# Patient Record
Sex: Female | Born: 2000 | Race: Black or African American | Hispanic: No | Marital: Single | Smoking: Never smoker
Health system: Southern US, Community
[De-identification: ages and names within clinical notes are randomized; demographics above are authoritative.]

---
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
## 2022-02-02 ENCOUNTER — Ambulatory Visit (HOSPITAL_COMMUNITY)
Admission: EM | Admit: 2022-02-02 | Discharge: 2022-02-02 | Disposition: A | Discharge status: Home / Self Care | Payer: Medicaid Other | Attending: Psychiatry | Admitting: Psychiatry

## 2022-02-02 ENCOUNTER — Ambulatory Visit (INDEPENDENT_AMBULATORY_CARE_PROVIDER_SITE_OTHER): Payer: Medicaid Other | Admitting: Family Medicine

## 2022-02-02 VITALS — BP 118/53 | HR 93 | Ht 69.0 in | Wt 201.0 lb

## 2022-02-02 DIAGNOSIS — F4323 Adjustment disorder with mixed anxiety and depressed mood: Secondary | ICD-10-CM

## 2022-02-02 DIAGNOSIS — F39 Unspecified mood [affective] disorder: Secondary | ICD-10-CM | POA: Diagnosis present

## 2022-02-02 DIAGNOSIS — Z30017 Encounter for initial prescription of implantable subdermal contraceptive: Secondary | ICD-10-CM | POA: Diagnosis not present

## 2022-02-02 DIAGNOSIS — B360 Pityriasis versicolor: Secondary | ICD-10-CM | POA: Diagnosis not present

## 2022-02-02 DIAGNOSIS — Z553 Underachievement in school: Secondary | ICD-10-CM | POA: Insufficient documentation

## 2022-02-02 LAB — POCT URINE PREGNANCY: Preg Test, Ur: NEGATIVE

## 2022-02-02 MED ORDER — SELENIUM SULFIDE 2.5 % EX LOTN: 1.0000 "application " | TOPICAL_LOTION | Freq: Every day | CUTANEOUS | 1 refills | Status: DC | PRN

## 2022-02-02 MED ORDER — MEDROXYPROGESTERONE ACETATE 150 MG/ML IM SUSP
150.0000 mg | Freq: Once | INTRAMUSCULAR | Status: AC
  Administered 2022-02-02: 150 mg via INTRAMUSCULAR

## 2022-02-02 NOTE — ED Triage Notes (Signed)
Pt presents to [facility] with passive SI. Pt states she went to her PCP today and made a comment about being suicidal 3 days ago and they recommended an evaluation. Pt states that she was feeling suicidal 3 days ago but she does not have this feeling today. Pt states that she was feeling overwhelmed with financial, relationship and friendship issues this week. Pt is tearful during triage process. Pt states she was diagnosed with depression/anxiety 4 years ago but was not prescribed medication.Pt states that she would be able to keep herself safe and she lives with her father [name] and gives permission to contact him for safety. Pt states that she would like to get started with outpatient services. Pt denies HI and AVH. ?

## 2022-02-02 NOTE — Patient Instructions (Addendum)
Thank you for coming to see me today. It was a pleasure.  ? ?Go to [facility] now to be evaluated. ?[address], [place], [place]  ?[phone] ? ?I will call you later today to see how things are ? ?Please follow-up with PCP in 1 week ? ?If you have any questions or concerns, please do not hesitate to call the office at [phone]. ? ?Best,  ? ?Carollee Leitz, MD  ? ?  ?Therapy and Counseling Resources ?Most providers on this list will take Medicaid. Patients with commercial insurance or Medicare should contact their insurance company to get a list of in network providers. ? ?[facility] ? [address] [place], [place] [address]      [phone] ? ?[facility] ?[place], [place] [address] ?[phone] ? ?[facility] ?[address] [place], [place] [address]       [phone]    ? ? ?[facility] ?[address], [place], [place] ? ?[facility]:  [address] ? ?[facility]:  ?[place] [phone] ? ?[employer] [facility] (under & uninsured) ?[address] [place] [phone]    [email or web address]   ? ?[facility] ?[place]     Phone:  [phone]     Alba Belden  [phone]  ? ?[place] ?[address] [place], [place] ext [phone] ? ?[facility]: [place], [place], [place]  ? ?[facility] ([place] therapist) ?[place] [address]   [place] [address]    [phone]   ? ?[facility]   ?[place] [place],  [place], [place]  ? ?[facility]  ?[address] [place]  [phone] ? ?[facility]  ?[place], [place]        [phone] ? ?Open Access/Walk In Clinic under & uninsured ?[place]ly Sessions, To schedule an appointment call [phone]- [phone] ?[address], [place] [phone]):  Molli Knock - Fri from 8 AM - 3 PM ?Moving June 1 to [employer] at [facility] [address], [place] [address] ? ?[facility] [place],  ?([place])   [place]: [phone]) 8:30 - 12; 1 - 2:30 ? ?[facility] [place],  ?[address], [place]    [phone]):8:30 - 12; 2 - 3PM ? ?[facility],  ?[address]  [place]; [phone]):   Mon - Fri 8 AM - 5 PM ? ?[facility] ?[place]  MWF 12:30 to 3:00 or call to schedule an appointment  [phone] ? ?Specific Provider options ?Psychology Today  [email or web address]://www.psychologytoday.com/us ?click on find a therapist  ?enter your zip code ?left side and select or tailor a therapist for your specific need.  ? ?[facility] Provider Directory ?[email or web address]  (Medicaid)   Follow all drop down to find a provider ? ?Social Support program ?[place] ?[phone] or [email or web address] ?[address], [place], [place] Recovery support and educational  ? ?In home counseling ?[place] ?Telephone: [phone]  office in [facility] [email or web address]   ?  Does not take reg. Medicaid or Medicare ?private insurance [employer], [place] health Choice, [place], [place], [place], [place], [place] Choice ? ?24- Hour Availability:  ?[place] or [phone] ? ?[facility] [employer] [phone] ? ?[facility]  [phone]  ? ?[place]  [phone] (after hours) ? ?Therapeutic Alternative/Mobile Crisis   [phone] ? ?[place] National Suicide Hotline  704-674-3678 Diamantina Monks) ? ?Call 911 or go to emergency room ? ?[employer]  [phone]);   [place] and [place]  ? ?[facility]  ?[phone]); [place], [place], [place], [place], [place], [place], [place]  ?

## 2022-02-02 NOTE — Progress Notes (Signed)
Patient here today for Depo Provera injection  Depo given in RUOQ today.   ? ?Site unremarkable & patient tolerated injection.   ? ?Next injection due July 21st-August 4th.  Reminder card given.   ? ?Sunday Spillers, CMA  ?

## 2022-02-02 NOTE — ED Provider Notes (Signed)
Behavioral Health Urgent Care Medical Screening Exam ? ?Patient Name: Danielle Miranda ?MRN: [id number] ?Date of Evaluation: 02/02/22 ?Chief Complaint:   ?Diagnosis:  ?Final diagnoses:  ?Adjustment disorder with mixed anxiety and depressed mood  ? ? ?History of Present illness: Verlisa Vara is a 21 y.o. female. Pt interviewed by this MD in her room, and chart reviewed. Pt was referred by her PCP's office this morning, due to last experiencing SI 3 days ago. Pt had thoughts of overdosing on her dad's opiate pain pills intermittently over the past 1 year, but never acted on it.  Pt reports financial, school, relationship, and educational stress over the past year, so has been feeling depressed with poor motivation/interest/energy/concentration/sleep. Fair appetite. Pt is a Printmaker in college, and has poor grades d/t difficulty focusing. Pt does not feel she has a close relationship with her father whom she lives with, so she did not tell him about her depression. No HI/AVH. ? ?Past Psychiatric History: ?Previous Medication Trials: no ?Previous Psychiatric Hospitalizations: no ?Previous Suicide Attempts: no ?History of Violence: no ?Outpatient psychiatrist: no ? ?Social History: ?Marital Status: single ?Children: 0 ?Source of Income: student ?Education:  freshman in [place] ?Special Ed: no ?Housing Status: college housing and father's home ?History of phys/sexual abuse: no ?Easy access to gun: no ? ?Substance Use (with emphasis over the last 12 months) ?Recreational Drugs: occasional marijuana use ?Use of Alcohol: occasional, social use ?Tobacco Use: no ?Rehab History: no ?H/O Complicated Withdrawal: no ? ?Legal History: ?Past Charges/Incarcerations: no ?Pending charges: no ? ?Family Psychiatric History: ?no  ? ?Psychiatric Specialty Exam ? ?Presentation  ?General Appearance:Appropriate for Environment; Casual; Fairly Groomed ? ?Eye Contact:Minimal ? ?Speech:Slow ? ?Speech  Volume:Decreased ? ?Handedness:Right ? ? ?Mood and Affect  ?Mood:Dysphoric; Anxious ? ?Affect:Constricted; Congruent; Tearful ? ? ?Thought Process  ?Thought Processes:Coherent; Goal Directed; Linear ? ?Descriptions of Associations:Intact ? ?Orientation:Full (Time, Place and Person) ? ?Thought Content:Logical; Rumination ?   Hallucinations:None ? ?Ideas of Reference:None ? ?Suicidal Thoughts:No ? ?Homicidal Thoughts:No ? ? ?Sensorium  ?Memory:Immediate Fair; Recent Fair; Remote Fair ? ?Judgment:Fair ? ?Insight:Fair ? ? ?Executive Functions  ?Concentration:Fair ? ?Attention Span:Fair ? ?Recall:Fair ? ?Fund of Knowledge:Fair ? ?Language:Fair ? ? ?Psychomotor Activity  ?Psychomotor Activity:Normal ? ? ?Assets  ?Assets:Communication Skills; Desire for Improvement; Financial Resources/Insurance; Housing; Physical Health; Social Support; Resilience; Vocational/Educational ? ? ?Sleep  ?Sleep:Poor ? ?Number of hours: No data recorded ? ?No data recorded ? ?Physical Exam: ?Physical Exam ?ROS ?Blood pressure 133/89, pulse 82, temperature 98.6 ?F (37 ?C), temperature source Oral, resp. rate 18, SpO2 100 %. There is no height or weight on file to calculate BMI. ? ?Musculoskeletal: ?Strength & Muscle Tone: within normal limits ?Gait & Station: normal ?Patient leans: N/A ? ? ?[facility] MSE Discharge Disposition for Follow up and Recommendations: ?Based on my evaluation the patient does not appear to have an emergency medical condition and can be discharged with resources and follow up care in outpatient services for Medication Management and Individual Therapy ?Spoke to father on phone for collateral info, and father was unaware of pt's depressive symptoms. Advised father to lock away his pain (opiate) pills, and he agreed to do so. Father had a good visit with pt in [facility], and father/pt agreed that pt should be safe to return home with outpatient mental health follow up.  ? ? ?Ancil Linsey, MD ?02/02/2022, 12:24 PM ? ?

## 2022-02-02 NOTE — Assessment & Plan Note (Addendum)
PHQ-9 score 23, with positive answer to question #9 pertaining to self harm.  MDQ screening positive.  ?Recommend patient be seen at behavioral health urgent care today.  Patient in agreement and plan to go directly to [facility] after clinic. ?CMA notified [facility] that patient was on route and that MD would be available for any consultation needed for further information.  Cell phone provided. ? ?05/07 addendum ?Called patient to follow-up from [facility] eval.  Reports feeling well with no suicidal ideation at present.  Does not have follow-up appointment with behavioral health and reports does not know who to call to set up appointment. ?We will refer to CCM to help with connection to outside resources mental health therapy ?Resources provided on AVS, patient can access through MyChart. ?Encouraged to schedule follow-up appointment with PCP this week. ?
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
## 2022-02-02 NOTE — Discharge Instructions (Addendum)
?  In the event of worsening symptoms, call the crisis hotline, 911 and or go to the nearest ED for appropriate evaluation and treatment of symptoms. ?follow-up with your primary care provider for your other medical issues, concerns and or health care needs.  ?

## 2022-02-04 ENCOUNTER — Encounter: Payer: Self-pay | Admitting: Family Medicine

## 2022-02-04 DIAGNOSIS — B360 Pityriasis versicolor: Secondary | ICD-10-CM | POA: Insufficient documentation

## 2022-02-04 NOTE — Assessment & Plan Note (Signed)
UPT negative ?Depo injection administered by CMA. ?Follow-up in 3 months for next injection. ? ?

## 2022-02-04 NOTE — Assessment & Plan Note (Signed)
Selenium sulfide shampoo 1% daily x14 days ?Follow-up with PCP as needed or if no improvement in symptoms. ?

## 2022-02-05 ENCOUNTER — Telehealth: Payer: Self-pay

## 2022-02-05 NOTE — Telephone Encounter (Signed)
-----   Message from Dana Allan, MD sent at 02/04/2022  4:23 PM EDT ----- ?Hi Team,  ? ?Please call patient to schedule follow up appointment with PCP this week. ? ?Thank you ?Dana Allan, MD ?Family Medicine Residency  ?  ? ?

## 2022-02-05 NOTE — Telephone Encounter (Signed)
Called LVM asking for a return call to our office and schedule an appt with PCP Dr Leary Roca.  Will also send mychart message. Sunday Spillers, CMA  ?

## 2022-02-20 ENCOUNTER — Ambulatory Visit (INDEPENDENT_AMBULATORY_CARE_PROVIDER_SITE_OTHER): Payer: Medicaid Other | Admitting: Family Medicine

## 2022-02-20 ENCOUNTER — Encounter: Payer: Self-pay | Admitting: Family Medicine

## 2022-02-20 VITALS — BP 111/78 | HR 96 | Ht 69.0 in | Wt 200.2 lb

## 2022-02-20 DIAGNOSIS — F322 Major depressive disorder, single episode, severe without psychotic features: Secondary | ICD-10-CM

## 2022-02-20 DIAGNOSIS — F39 Unspecified mood [affective] disorder: Secondary | ICD-10-CM | POA: Diagnosis not present

## 2022-02-20 DIAGNOSIS — B360 Pityriasis versicolor: Secondary | ICD-10-CM | POA: Diagnosis not present

## 2022-02-20 MED ORDER — KETOCONAZOLE 2 % EX CREA: 1.0000 "application " | TOPICAL_CREAM | Freq: Two times a day (BID) | CUTANEOUS | 0 refills | Status: DC

## 2022-02-20 MED ORDER — SERTRALINE HCL 50 MG PO TABS: ORAL_TABLET | ORAL | 0 refills | Status: DC

## 2022-02-20 NOTE — Patient Instructions (Addendum)
It was a pleasure to see you today!  For your rash: use the ketoconazole cream twice a day on your rash for 14 days. Follow up and let's see if it's improved Start taking zoloft: take 1/2 a tablet (25 mg) once a day for 1 week, then increase to 1 whole tablet, thereafter. Call if you have troublesome side effects, such as shaking hands. Follow up with me next week on may 30th Use the list below to contact therapy resources If feelings of harming yourself worsen, please use the resources below or call us  Be Well,  Dr. Chauncey Reading   If you are feeling suicidal or depression symptoms worsen please immediately go to:   If you are thinking about harming yourself or having thoughts of suicide, or if you know someone who is, seek help right away. If you are in crisis, make sure you are not left alone.  If someone else is in crisis, make sure he/she/they is not left alone  Call 988 OR 1-800-273-TALK  24 Hour Availability for [place]  [address] [place], [place] Crisis [phone]    Other crisis resources:  [facility] [employer] (Domestic Violence, Rape & Victim Assistance [phone]  [facility] [place]    (ONLY from 8am-4pm)    [phone]  [facility] Mobile Crisis Unit (24/7)   [phone]  [place] National Suicide Hotline   [phone] Diamantina Monks)   Therapy and Counseling Resources Most providers on this list will take Medicaid. Patients with commercial insurance or Medicare should contact their insurance company to get a list of in network providers.  [facility] (spanish speaking therapist available)(habla espanol)(take medicare and medicaid)  [place], [place], [place] [address], [place] [email or web address] [phone]  [facility]:Psychiatry and Counseling [address] [place], [place] [address] [place], [place], [place] [address]      [phone]  [place] (spanish available) [place], [place] [address] [place] (take [facility] and medicare) [address] [place], [place] [address]       [phone]     [place] (virtual only) [phone]  [facility] [address] [place], [place]  Family [facility]:  [place]. [place] [phone]  [facility]:  [place] [address] [place] [phone]  [facility] (under & uninsured) [address], [place] [phone]    [email or web address]    [place] [address]  [place]    [phone]  [facility] [place]     Phone:  [phone]     [facility] [place]  [place] [address]      [place], [place] ext [place]: [place], [place], [place]   [place] ([place] therapist) [email or web address]  [place] [address]   [place] [address]    [phone]    [facility]   [address] [place],  [place], [place]   [place]  [place]  [place]  [address] [place], [place]        [phone]  Open Access/Walk In Clinic under & uninsured  [facility]  [place], [place]  [facility] the [place],  ([place])   [place], [place]: [phone]) 8:30 - 12; 1 - 2:30  [facility] the [place],  [place], [place]    ([phone]):8:30 - 12; 2 - 3PM  [facility] [place],  [address],  [place]; [phone]):   Mon - Fri 8 AM - 5 PM  [facility] [place]  MWF 12:30 to 3:00 or call to schedule an appointment  [phone]  Specific Provider options Psychology Today  [email or web address] click on find a therapist  enter your zip code left side and select or tailor a therapist for your specific need.   [facility] Provider Directory [email or web address]  (Medicaid)   Follow all drop down to find a provider  [place] or [email or web address] [address], [place], [place] Recovery support and educational   24- Hour Availability:   [facility]  [address] [place], [place] Crisis [phone]  [facility] [employer] [phone]  [place]  [phone]   [place]  [phone] (after hours)  Therapeutic Alternative/Mobile Crisis   [phone]  [place] National Suicide Hotline  [phone] Diamantina Monks)  Call 911 or go to emergency room  [facility]  [phone]);  [place] and [employer]  [phone]); [address] [place], [place], [place], [place], [place], [place], [place]

## 2022-02-20 NOTE — Assessment & Plan Note (Addendum)
Patient has passive SI today. Discussed with preceptor, Dr. McDiarmid. She does not have access to means of harm, no prior attempts. Gave patient resources for crisis, see AVS. SW for assistance getting into therapy. We discussed the nature of depression as a disease, SE's of SSRI, and timeline for medication trial. Patient wishes to proceed with medication. - zoloft 25mg  x7days, increase to 50 mg thereafter.  - follow up 1 week

## 2022-02-20 NOTE — Assessment & Plan Note (Signed)
Ketoconazole cream 2% BID x 14d.

## 2022-02-20 NOTE — Progress Notes (Signed)
    SUBJECTIVE:   CHIEF COMPLAINT / HPI:   Depression: recently had SI first week of May. Patient went to [facility]- determined that patient did not have active SI. She had reported plan to overdose on her father's opiate medications. The [facility] physician spoke with patient's father, opiates now locked up. Since then, patient reports continued symptoms of depression.  Including isolation, insomnia, low mood, suicidal ideation (patient has never attempted suicide before, plan would be to overdose on father's opiates, which she does not currently have access to), difficulty concentrating, low energy, low appetite.  Patient's symptoms started with several issues including her boyfriend having mental illness himself and not been is present in their relationship.  She reports that she gets most of her support from her brother.  She has a relationship with both of her parents, neither of which are financially stable and she feels a lot of pressure to succeed, however she is just in her freshman year of college and working and going to school.  She became overwhelmed and had difficulty making friends in her school, this isolation combined with being away from her family and the pressures of college led to worsening symptoms of depression over the last year.  She does not endorse any symptoms of mania.  She has not tried medications before but wishes to try them now.  She is interested in therapy.  PHQ-9- 21 GAD 7 18  Rash: patient notices this has worsened with warmer weather, sweating. She tried selsyn blue which did not work.  PERTINENT  PMH / PSH: mood disorder  OBJECTIVE:   BP 111/78   Pulse 96   Ht 5\' 9"  (1.753 m)   Wt 200 lb 3.2 oz (90.8 kg)   SpO2 100%   BMI 29.56 kg/m   Nursing note and vitals reviewed GEN: adolescent AAW, resting comfortably in chair, NAD, WNWD Neuro: AOx3  Skin: hyperpigmented macules across chest Ext: no edema Psych: tearful, full affect, appropriately groomed, fluent  speech, thought content congruent with emotions   ASSESSMENT/PLAN:   Mood disorder ([place]) Patient has passive SI today. Discussed with preceptor, Dr. McDiarmid. She does not have access to means of harm, no prior attempts. Gave patient resources for crisis, see AVS. SW for assistance getting into therapy. We discussed the nature of depression as a disease, SE's of SSRI, and timeline for medication trial. Patient wishes to proceed with medication. - zoloft 25mg  x7days, increase to 50 mg thereafter.  - follow up 1 week  Pityriasis versicolor Ketoconazole cream 2% BID x 14d.     Gladys Damme, MD [place]

## 2022-02-24 ENCOUNTER — Telehealth (HOSPITAL_COMMUNITY): Payer: Self-pay | Admitting: Family Medicine

## 2022-02-24 NOTE — BH Assessment (Signed)
Care Management - BHUC Follow Up Discharges  ° °Writer attempted to make contact with patient today and was unsuccessful.  Writer left a HIPPA compliant voice message.  ° °Per chart review, patient was provided with outpatient resources. ° °

## 2022-02-27 ENCOUNTER — Ambulatory Visit (INDEPENDENT_AMBULATORY_CARE_PROVIDER_SITE_OTHER): Payer: Medicaid Other | Admitting: Family Medicine

## 2022-02-27 ENCOUNTER — Encounter: Payer: Self-pay | Admitting: Family Medicine

## 2022-02-27 DIAGNOSIS — F39 Unspecified mood [affective] disorder: Secondary | ICD-10-CM

## 2022-02-27 DIAGNOSIS — Z3042 Encounter for surveillance of injectable contraceptive: Secondary | ICD-10-CM

## 2022-02-27 NOTE — Patient Instructions (Addendum)
It was a pleasure to see you today!  Keep up the good work! You will see results with your hard work. For rash: keep going with the rash cream twice a day for another week Follow up with me in 3 weeks Your next depo shot is due July 21st-Aug 4th, you have an appt for Friday July 21st at 3 PM   Be Well,  Dr. Leary Roca

## 2022-02-27 NOTE — Progress Notes (Signed)
    SUBJECTIVE:   CHIEF COMPLAINT / HPI:   Depression f/u: PHQ-9 elevated at 21 today.  Patient marked to for question 9, but clarified that this is from her previous episodes of passive SI, she states that since starting Zoloft 1 week ago she has had absolutely no thoughts that she would be better off dead or of suicidal ideation whatsoever.  Patient started Zoloft 25 mg last week, she has not yet increased to 50 mg.  She is working on finding a Paramedic.  Given a list of therapy options as well as crisis resources.  Rash: Patient was prescribed ketoconazole cream 2% twice daily at last appointment.  Today she reports an improvement especially along her chest.  She still has some rash present along her lower breast.  She is only used the ketoconazole for 1 week.  PERTINENT  PMH / PSH: tinea versicolor, MDD  OBJECTIVE:   BP 116/67   Pulse 72   Ht 5\' 9"  (1.753 m)   Wt 190 lb 8 oz (86.4 kg)   SpO2 99%   BMI 28.13 kg/m   Nursing note and vitals reviewed GEN: Adolescent, AAW, resting comfortably in chair, NAD, WNWD Neuro: AOx3  Ext: no edema Psych: Pleasant and appropriate, not tearful today, full affect and smiled at beginning, thought processes are linear and congruent, speech is fluent, patient is appropriately groomed ASSESSMENT/PLAN:   Mood disorder (HCC) Patient has already seen improvement with Zoloft 25 mg with decrease in SI.  Will continue with Zoloft, increase to 50 mg.  Follow-up in 3 weeks.  Discussed options for therapy including crisis resources in case SI returns.  Patient voices understanding, she is hopeful that she is improving since her SI has completely gone away.  Contraception management Schedule patient for her next Depo shot on July 21 with nurse appointment.  Her window for Depo is July 21 through August 4.     11-16-1981, MD [facility]

## 2022-02-27 NOTE — Assessment & Plan Note (Signed)
Patient has already seen improvement with Zoloft 25 mg with decrease in SI.  Will continue with Zoloft, increase to 50 mg.  Follow-up in 3 weeks.  Discussed options for therapy including crisis resources in case SI returns.  Patient voices understanding, she is hopeful that she is improving since her SI has completely gone away.

## 2022-02-27 NOTE — Assessment & Plan Note (Signed)
Schedule patient for her next Depo shot on July 21 with nurse appointment.  Her window for Depo is July 21 through August 4.

## 2022-03-06 ENCOUNTER — Encounter: Payer: Self-pay | Admitting: *Deleted
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
## 2022-03-19 ENCOUNTER — Other Ambulatory Visit: Payer: Self-pay | Admitting: Family Medicine

## 2022-03-19 DIAGNOSIS — F322 Major depressive disorder, single episode, severe without psychotic features: Secondary | ICD-10-CM

## 2022-03-20 ENCOUNTER — Ambulatory Visit: Payer: Medicaid Other | Admitting: Family Medicine

## 2022-03-28 ENCOUNTER — Ambulatory Visit: Payer: Medicaid Other | Admitting: Family Medicine

## 2022-04-20 ENCOUNTER — Ambulatory Visit (INDEPENDENT_AMBULATORY_CARE_PROVIDER_SITE_OTHER): Payer: Medicaid Other

## 2022-04-20 ENCOUNTER — Telehealth: Payer: Self-pay | Admitting: Family Medicine

## 2022-04-20 DIAGNOSIS — Z3042 Encounter for surveillance of injectable contraceptive: Secondary | ICD-10-CM

## 2022-04-20 MED ORDER — MEDROXYPROGESTERONE ACETATE 150 MG/ML IM SUSP
150.0000 mg | Freq: Once | INTRAMUSCULAR | Status: AC
  Administered 2022-04-20: 150 mg via INTRAMUSCULAR

## 2022-04-20 NOTE — Telephone Encounter (Signed)
Patient here for appt requesting refill of:  Name of Medication(s):  Prozac per patient Last date of OV:  02/27/22 Pharmacy:  Same  Will route refill request to Clinic RN.  Discussed with patient policy to call pharmacy for future refills.  Also, discussed refills may take up to 48 hours to approve or deny.  Danielle Miranda

## 2022-04-20 NOTE — Progress Notes (Signed)
Patient here today for Depo Provera injection and is within her dates.    Last contraceptive appt was 02/02/2022  Depo given in LUOQ today.  Site unremarkable & patient tolerated injection.    Next injection due 07/06/22-07/20/22.  Reminder card given.    Veronda Prude, RN

## 2022-04-30 ENCOUNTER — Other Ambulatory Visit: Payer: Self-pay | Admitting: Family Medicine

## 2022-04-30 DIAGNOSIS — F322 Major depressive disorder, single episode, severe without psychotic features: Secondary | ICD-10-CM

## 2022-04-30 MED ORDER — SERTRALINE HCL 50 MG PO TABS: ORAL_TABLET | ORAL | 0 refills | Status: DC

## 2022-05-23 ENCOUNTER — Ambulatory Visit: Payer: Medicaid Other | Admitting: Family Medicine

## 2022-07-09 ENCOUNTER — Encounter: Payer: Self-pay | Admitting: Family Medicine

## 2022-07-09 ENCOUNTER — Ambulatory Visit (INDEPENDENT_AMBULATORY_CARE_PROVIDER_SITE_OTHER): Payer: Medicaid Other | Admitting: Family Medicine

## 2022-07-09 ENCOUNTER — Ambulatory Visit (HOSPITAL_COMMUNITY)
Admission: EM | Admit: 2022-07-09 | Discharge: 2022-07-10 | Disposition: A | Discharge status: Home / Self Care | Payer: Medicaid Other | Attending: Family | Admitting: Family

## 2022-07-09 VITALS — BP 133/71 | HR 98 | Ht 69.0 in | Wt 203.8 lb

## 2022-07-09 DIAGNOSIS — F39 Unspecified mood [affective] disorder: Secondary | ICD-10-CM | POA: Diagnosis not present

## 2022-07-09 DIAGNOSIS — R45851 Suicidal ideations: Secondary | ICD-10-CM | POA: Insufficient documentation

## 2022-07-09 DIAGNOSIS — Z1152 Encounter for screening for COVID-19: Secondary | ICD-10-CM | POA: Insufficient documentation

## 2022-07-09 DIAGNOSIS — Z79899 Other long term (current) drug therapy: Secondary | ICD-10-CM | POA: Diagnosis not present

## 2022-07-09 DIAGNOSIS — Z309 Encounter for contraceptive management, unspecified: Secondary | ICD-10-CM | POA: Diagnosis not present

## 2022-07-09 LAB — RESP PANEL BY RT-PCR (FLU A&B, COVID) ARPGX2
Influenza A by PCR: NEGATIVE
Influenza B by PCR: NEGATIVE
SARS Coronavirus 2 by RT PCR: NEGATIVE

## 2022-07-09 LAB — POCT URINE DRUG SCREEN - MANUAL ENTRY (I-SCREEN)
POC Amphetamine UR: NOT DETECTED
POC Buprenorphine (BUP): NOT DETECTED
POC Cocaine UR: NOT DETECTED
POC Marijuana UR: POSITIVE — AB
POC Methadone UR: NOT DETECTED
POC Methamphetamine UR: NOT DETECTED
POC Morphine: NOT DETECTED
POC Oxazepam (BZO): NOT DETECTED
POC Oxycodone UR: NOT DETECTED
POC Secobarbital (BAR): NOT DETECTED

## 2022-07-09 LAB — POC SARS CORONAVIRUS 2 AG: SARSCOV2ONAVIRUS 2 AG: NEGATIVE

## 2022-07-09 MED ORDER — MAGNESIUM HYDROXIDE 400 MG/5ML PO SUSP: 30.0000 mL | Freq: Every day | ORAL | Status: DC | PRN

## 2022-07-09 MED ORDER — MEDROXYPROGESTERONE ACETATE 150 MG/ML IM SUSP
150.0000 mg | Freq: Once | INTRAMUSCULAR | Status: AC
  Administered 2022-07-09: 150 mg via INTRAMUSCULAR

## 2022-07-09 MED ORDER — ACETAMINOPHEN 325 MG PO TABS: 650.0000 mg | ORAL_TABLET | Freq: Four times a day (QID) | ORAL | Status: DC | PRN

## 2022-07-09 MED ORDER — ALUM & MAG HYDROXIDE-SIMETH 200-200-20 MG/5ML PO SUSP: 30.0000 mL | ORAL | Status: DC | PRN

## 2022-07-09 NOTE — ED Notes (Signed)
Pt sitting on bed quietly. Stated slept during the day so doesn't feel sleepy. Will monitor for safety.

## 2022-07-09 NOTE — ED Provider Notes (Cosign Needed Addendum)
[facility] Continuous Assessment Admission H&P  Date: 07/09/22 Patient Name: Danielle Miranda MRN: [id number] Chief Complaint:  Chief Complaint  Patient presents with   Depression   Suicidal      Diagnoses:  Final diagnoses:  Mood disorder [facility])    HPI: History of Present illness: Danielle Miranda is a 21 y.o. female.  Presents to [facility] urgent care after recent visit to her primary care provider.  Patient was transported by safe transport due to suicidal ideations with active plan to harm herself.  Per provider assessment note: " Concerning that patient having active suicidal ideations with a plan although ongoing I am still concerned because she states that she is still open to the idea of harming herself" Reports she has been prescribed Zoloft 50 mg with a recent increase to Zoloft 75 mg.  She reports she was seeking therapy and psychiatry services. However, continues to express passive fleeting thoughts of death.   Upon evaluation patient reports suicidal plan to overdose on father's medication.  Reports she has been suicidal for the past year due to multiple stressors related to being the sole supporter in the household.  Stated " my father or mother is not working." States she has not been able to return back to college because she is unable to afford it at this time.  Stated that she has been suicidal for the past year.  Reported " most of the time it's just thoughts." She denied plan or intent.   However, patient was asked if she is currently having thoughts to harm herself. Patient started laughing and said " I guess."   Recommended overnight observation. Leshay then requested to speak to this provider. Education was provided with safety concerns with discharging patient after reporting suicidal thoughts with a plan.  Ashima then denied statement and provided a different version of the reason she was suicidal.  She provided verbal authorization to speak to her father. He denied  any safety concerns with patient returning home and was hoping that patient can be discharged home to him.    During evaluation Shadana Stanislawski sitting in no acute distress. She is alert/oriented x 4; calm/cooperative; and mood congruent with affect. She is speaking in a clear tone at moderate volume, and normal pace; with good eye contact. Her thought process is coherent and relevant; There is no indication that she is currently responding to internal/external stimuli or experiencing delusional thought content; and psychosis, and paranoia.  Patient has remained calm throughout assessment and has answered questions appropriately.    PHQ 2-9:  Flowsheet Row Office Visit from 07/09/2022 in [facility] Office Visit from 02/27/2022 in [facility] Office Visit from 02/20/2022 in [facility]  Thoughts that you would be better off dead, or of hurting yourself in some way Nearly every day More than half the days Not at all  PHQ-9 Total Score 18 21 0       Flowsheet Row ED from 06/18/2021 in [facility] ED from 06/09/2021 in [facility] ED from 06/02/2021 in [facility] RISK CATEGORY No Risk Error: Question 6 not populated No Risk        Total Time spent with patient: 15 minutes  Musculoskeletal  Strength & Muscle Tone: within normal limits Gait & Station: normal Patient leans: N/A  Psychiatric Specialty Exam  Presentation General Appearance:  Appropriate for Environment;  Casual; Fairly Groomed  Eye Contact: Minimal  Speech: Slow  Speech Volume: Decreased  Handedness: Right   Mood and Affect  Mood: Dysphoric; Anxious  Affect: Constricted; Congruent; Tearful   Thought Process  Thought Processes: Coherent; Goal Directed; Linear  Descriptions of Associations:Intact  Orientation:Full (Time, Place and Person)  Thought  Content:Logical; Rumination    Hallucinations:No data recorded Ideas of Reference:None  Suicidal Thoughts:No data recorded Homicidal Thoughts:No data recorded  Sensorium  Memory: Immediate Fair; Recent Fair; Remote Fair  Judgment: Fair  Insight: Fair   Community education officer  Concentration: Fair  Attention Span: Fair  Recall: [employer] of Knowledge: Fair  Language: Fair   Psychomotor Activity  Psychomotor Activity:No data recorded  Assets  Assets: Communication Skills; Desire for Improvement; Financial Resources/Insurance; Housing; Physical Health; Social Support; Resilience; Vocational/Educational   Sleep  Sleep:No data recorded  No data recorded  Physical Exam Vitals and nursing note reviewed.  Cardiovascular:     Rate and Rhythm: Normal rate and regular rhythm.  Neurological:     General: No focal deficit present.     Mental Status: She is oriented to person, place, and time.  Psychiatric:        Mood and Affect: Mood normal.        Behavior: Behavior normal.    Review of Systems  HENT: Negative.    Cardiovascular: Negative.   Genitourinary: Negative.   Skin: Negative.   Psychiatric/Behavioral:  Positive for depression and suicidal ideas. The patient is nervous/anxious.   All other systems reviewed and are negative.   Blood pressure 124/76, pulse 83, temperature 98 F (36.7 C), temperature source Oral, resp. rate 18, SpO2 100 %. There is no height or weight on file to calculate BMI.  Past Psychiatric History: Mood disorder, MDD and GAD    Is the patient at risk to self? No  Has the patient been a risk to self in the past 6 months? No .    Has the patient been a risk to self within the distant past? Yes   Is the patient a risk to others? Yes   Has the patient been a risk to others in the past 6 months? Yes   Has the patient been a risk to others within the distant past? Yes   Past Medical History: No past medical history on file. No  past surgical history on file.  Family History:  Family History  Family history unknown: Yes    Social History:  Social History   Socioeconomic History   Marital status: Single    Spouse name: Not on file   Number of children: Not on file   Years of education: Not on file   Highest education level: Not on file  Occupational History   Not on file  Tobacco Use   Smoking status: Never   Smokeless tobacco: Never   Tobacco comments:    Pt doesn't smoke cigarettes  Substance and Sexual Activity   Alcohol use: No   Drug use: No   Sexual activity: Never  Other Topics Concern   Not on file  Social History Narrative   Not on file   Social Determinants of Health   Financial Resource Strain: Not on file  Food Insecurity: Not on file  Transportation Needs: Not on file  Physical Activity: Not on file  Stress: Not on file  Social Connections: Not on file  Intimate Partner Violence: Not on file    SDOH:  SDOH Screenings   Depression (  PHQ2-9): High Risk (07/09/2022)  Tobacco Use: Low Risk  (07/09/2022)    Last Labs:  Office Visit on 02/02/2022  Component Date Value Ref Range Status   Preg Test, Ur 02/02/2022 Negative  Negative Final    Allergies: Flonase [fluticasone propionate]  PTA Medications: (Not in a hospital admission)   Medical Decision Making  Recommend overnight observation -Increase Zoloft 50 mg to 75 mg daily Consider initiating Abilify 2 mg p.o. daily    Recommendations  Based on my evaluation the patient does not appear to have an emergency medical condition.  Oneta Rack, NP 07/09/22  11:53 AM

## 2022-07-09 NOTE — ED Notes (Signed)
Pt. Alert, awake, oriented x4. Denies SI/HI/AVH. She reports she haws had issues with her mood and has not taken her zoloft in 4 days. She continues to refuse blood draw/labs. Flat affect, sad but cooperative. Will continue to monitor for safety.

## 2022-07-09 NOTE — Assessment & Plan Note (Signed)
-  concerning that patient having active SI with plan, although ongoing, I am still concerned because she states that she is still open to the idea of harming herself -extensively discussed that I feel my best medical recommendation for the safety of the patient is to get further evaluation at [facility], discussed with patient who is agreeable -discussed with attending, Dr. Gwendlyn Deutscher, who called transport which will be arriving shortly -PHQ-9 score of 18 with 3 for question 9 reviewed and extensively discussed -suicide hotline provided and discussed  -awaiting safe transport to [facility] -follow up in 1 week for mood check with PCP

## 2022-07-09 NOTE — Progress Notes (Signed)
    SUBJECTIVE:   CHIEF COMPLAINT / HPI:   Patient presents for mood check up, endorses that she feels she is the same since her last visit. Prescribed zoloft 50 mg, last took it 2 days ago and reports that she has occasionally missed some weeks although tries to be consistent. Currently in college, supposed to be taking an online class but has not had time recently. Neither of her parents are working at this time so she feels she is the provider which is stressful. She knows she does not make enough money to support the family. Father pressures her about school but he is not realized that then it adds more financial burden. She was very depressed her first year of college so she is balancing her work with family and holding off on school which is going better for her. Plans on returning back to school at some point. Endorses thoughts of harming herself, last had these thoughts about 2 days ago. Has a current, ongoing plan since last year to overdose on medication. But states that she thinks about the people that love her and how it would negatively affect them and she does not harm herself. Boyfriend is her support system. She has never attempted in the past. She is currently not seeing a therapist.   OBJECTIVE:   BP 133/71   Pulse 98   Ht 5\' 9"  (1.753 m)   Wt 203 lb 12.8 oz (92.4 kg)   SpO2 100%   BMI 30.10 kg/m   General: Patient in in no acute distress. CV: RRR, no murmurs or gallops Resp: CTAB, normal work of breathing noted Psych: endorsing active SI with plan in place  ASSESSMENT/PLAN:   Mood disorder ([place]) -concerning that patient having active SI with plan, although ongoing, I am still concerned because she states that she is still open to the idea of harming herself -extensively discussed that I feel my best medical recommendation for the safety of the patient is to get further evaluation at [facility], discussed with patient who is agreeable -discussed with attending, Dr. Gwendlyn Deutscher, who  called transport which will be arriving shortly -PHQ-9 score of 18 with 3 for question 9 reviewed and extensively discussed -suicide hotline provided and discussed  -awaiting safe transport to [facility] -follow up in 1 week for mood check with PCP  Contraception management -depo injection administered today per patient preference, patient tolerated well without complications -due in another 3 months, discussed with patient -safe sex counseling      Donney Dice, [place]

## 2022-07-09 NOTE — Progress Notes (Signed)
Pt admitted to Continuous Observation due to SI with plan to overdose. Pt currently denies and verbally contracts for safety on the unit. Pt is alert and oriented with flat and tearful affect. Pt is ambulatory and is oriented to staff/unit. Pt was cooperative with skin assessment. Pt denies pain and current HI/AVH. Meal offered but pt decline. Staff will monitor for pt's safety.

## 2022-07-09 NOTE — ED Notes (Signed)
Patient remains safe and cooperative on unit. No s/s of distress.

## 2022-07-09 NOTE — BH Assessment (Signed)
Comprehensive Clinical Assessment (CCA) Note  07/09/2022 Danielle Miranda [id number]  Disposition: Per Ricky Ala, NP, patient is recommended for overnight observation.  [place] ED from 07/09/2022 in [facility] ED from 06/18/2021 in [facility] at [facility] ED from 06/09/2021 in [facility] Moderate Risk No Risk Error: Question 6 not populated      The patient demonstrates the following risk factors for suicide: Chronic risk factors for suicide include: N/A. Acute risk factors for suicide include: family or marital conflict and social withdrawal/isolation. Protective factors for this patient include: responsibility to others (children, family). Considering these factors, the overall suicide risk at this point appears to be moderate. Patient is not appropriate for outpatient follow up.  Chief Complaint:  Chief Complaint  Patient presents with   Depression   Suicidal   Visit Diagnosis:  Mood disorder ([place])       CCA Screening, Triage and Referral (STR)  Patient Reported Information How did you hear about [place]? Primary Care  What Is the Reason for Your Visit/Call Today? Pt sent to [facility] via safe transport by her PCP for reporting worsening depression, stress and suicidal ideaiton s  How Long Has This Been Causing You Problems? > than 6 months  What Do You Feel Would Help You the Most Today? Treatment for Depression or other mood problem   Have You Recently Had Any Thoughts About Hurting Yourself? Yes  Are You Planning to Commit Suicide/Harm Yourself At This time? Yes (Pt vague about SI)   Have you Recently Had Thoughts About [place]? No  Are You Planning to Harm Someone at This Time? No  Explanation: No data recorded  Have You Used Any Alcohol or Drugs in the Past 24 Hours? No  How Long Ago Did You Use Drugs or Alcohol? No data recorded What Did You Use and How Much?  wine "couple sips".   Do You Currently Have a Therapist/Psychiatrist? No  Name of Therapist/Psychiatrist: No data recorded  Have You Been Recently Discharged From Any Office Practice or Programs? No  Explanation of Discharge From Practice/Program: No data recorded    CCA Screening Triage Referral Assessment Type of Contact: Face-to-Face  Telemedicine Service Delivery:   Is this Initial or Reassessment? No data recorded Date Telepsych consult ordered in [facility]:  No data recorded Time Telepsych consult ordered in [facility]:  No data recorded Location of Assessment: [facility] Assessment Services  Provider Location: [facility] Assessment Services   Collateral Involvement: none   Does Patient Have a [place]? No  Legal Guardian Contact Information: No data recorded Copy of Legal Guardianship Form: No data recorded Legal Guardian Notified of Arrival: No data recorded Legal Guardian Notified of Pending Discharge: No data recorded If Minor and Not Living with Parent(s), Who has Custody? na  Is CPS involved or ever been involved? Never  Is APS involved or ever been involved? Never   Patient Determined To Be At Risk for Harm To Self or Others Based on Review of Patient Reported Information or Presenting Complaint? Yes, for Self-Harm  Method: No data recorded Availability of Means: No data recorded Intent: No data recorded Notification Required: No data recorded Additional Information for Danger to Others Potential: No data recorded Additional Comments for Danger to Others Potential: No data recorded Are There Guns or Other Weapons in Your Home? No data recorded Types of Guns/Weapons: No data recorded Are These Weapons Safely  Secured?                            No data recorded Who Could Verify You Are Able To Have These Secured: No data recorded Do You Have any Outstanding Charges, Pending Court Dates, Parole/Probation? No data recorded Contacted To Inform of Risk of  Harm To Self or Others: Unable to Contact:    Does Patient Present under Involuntary Commitment? No  IVC Papers Initial File Date: No data recorded  [place] of Residence: [place]   Patient Currently Receiving the Following Services: Not Receiving Services   Determination of Need: Urgent (48 hours)   Options For Referral: Medication Management; Outpatient Therapy     CCA Biopsychosocial Patient Reported Schizophrenia/Schizoaffective Diagnosis in Past: No   Strengths: UTA   Mental Health Symptoms Depression:   Hopelessness; Irritability; Change in energy/activity; Sleep (too much or little); Difficulty Concentrating; Tearfulness; Worthlessness   Duration of Depressive symptoms:  Duration of Depressive Symptoms: Greater than two weeks   Mania:   None   Anxiety:    Worrying; Tension   Psychosis:   None   Duration of Psychotic symptoms:    Trauma:   None   Obsessions:   None   Compulsions:   None   Inattention:   None   Hyperactivity/Impulsivity:   None   Oppositional/Defiant Behaviors:   None   Emotional Irregularity:   None   Other Mood/Personality Symptoms:  No data recorded   Mental Status Exam Appearance and self-care  Stature:   Tall   Weight:   Average weight   Clothing:   Neat/clean; Age-appropriate   Grooming:   Normal   Cosmetic use:   None   Posture/gait:   Normal   Motor activity:   Not Remarkable   Sensorium  Attention:   Normal   Concentration:   Normal   Orientation:   Person; Situation; Place; Time   Recall/memory:   Normal   Affect and Mood  Affect:   Anxious; Depressed   Mood:   Anxious; Depressed   Relating  Eye contact:   Avoided   Facial expression:   Responsive   Attitude toward examiner:   Cooperative   Thought and Language  Speech flow:  Clear and Coherent   Thought content:   Appropriate to Mood and Circumstances   Preoccupation:   None   Hallucinations:   None    Organization:  No data recorded  [employer] of Knowledge:   Fair   Intelligence:   Average   Abstraction:   Normal   Judgement:   Fair   Dance movement psychotherapist:   Adequate   Insight:   Fair   Decision Making:   Normal   Social Functioning  Social Maturity:   Responsible   Social Judgement:   Normal   Stress  Stressors:   Family conflict; Financial; School; Relationship   Coping Ability:   Overwhelmed   Skill Deficits:   None   Supports:   Friends/Service system; Family; Support needed     Religion: Religion/Spirituality Are You A Religious Person?:  (UTA) How Might This Affect Treatment?: UTA  Leisure/Recreation: Leisure / Recreation Do You Have Hobbies?: No  Exercise/Diet: Exercise/Diet Do You Exercise?: No Have You Gained or Lost A Significant Amount of Weight in the Past Six Months?: No Do You Follow a Special Diet?: No Do You Have Any Trouble Sleeping?: Yes Explanation of Sleeping Difficulties: Difficult time sleeping  at night. Reports 5 hours average daily   CCA Employment/Education Employment/Work Situation: Employment / Work Situation Employment Situation: Employed Work Stressors: none Patient's Job has Been Impacted by Current Illness: No Has Patient ever Been in Equities trader?: No  Education: Education Is Patient Currently Attending School?: No (Was attending [employer]. Now on academic suspension) Last Grade Completed: 13 Did You Attend College?: Yes What Type of College Degree Do you Have?: Business Did You Have An Individualized Education Program (IIEP): No Did You Have Any Difficulty At School?: No Patient's Education Has Been Impacted by Current Illness: No   CCA Family/Childhood History Family and Relationship History: Family history Marital status: Single Does patient have children?: No  Childhood History:  Childhood History By whom was/is the patient raised?: Father Did patient suffer any  verbal/emotional/physical/sexual abuse as a child?: No Did patient suffer from severe childhood neglect?: No Has patient ever been sexually abused/assaulted/raped as an adolescent or adult?: No Was the patient ever a victim of a crime or a disaster?: No Witnessed domestic violence?: No Has patient been affected by domestic violence as an adult?: No  Child/Adolescent Assessment:     CCA Substance Use Alcohol/Drug Use: Alcohol / Drug Use Pain Medications: See MAR Prescriptions: See MAR Over the Counter: See MAR History of alcohol / drug use?: Yes Longest period of sobriety (when/how long): NA Negative Consequences of Use:  (none) Withdrawal Symptoms: None Substance #1 Name of Substance 1: THC 1 - Age of First Use: 17 1 - Amount (size/oz): 3 blunts 1 - Frequency: daily 1 - Duration: ongoing 1 - Last Use / Amount: week ago 1 - Method of Aquiring: smoking 1- Route of Use: unknonwn                       ASAM's:  Six Dimensions of Multidimensional Assessment  Dimension 1:  Acute Intoxication and/or Withdrawal Potential:   Dimension 1:  Description of individual's past and current experiences of substance use and withdrawal: Pt denies any other drug use and has used THC since age 59  Dimension 2:  Biomedical Conditions and Complications:   Dimension 2:  Description of patient's biomedical conditions and  complications: No medical complaints  Dimension 3:  Emotional, Behavioral, or Cognitive Conditions and Complications:  Dimension 3:  Description of emotional, behavioral, or cognitive conditions and complications: THC use possibly enhancing depressive symptoms  Dimension 4:  Readiness to Change:  Dimension 4:  Description of Readiness to Change criteria: Pt does not see a problem with THC use therefore in precontemplation stage of chagne  Dimension 5:  Relapse, Continued use, or Continued Problem Potential:  Dimension 5:  Relapse, continued use, or continued problem  potential critiera description: Pt denies THC is causing any issues.  Dimension 6:  Recovery/Living Environment:  Dimension 6:  Recovery/Iiving environment criteria description: Pt has stable housing living with her dad. Reports some stress related to relationship with father.  ASAM Severity Score: ASAM's Severity Rating Score: 6  ASAM Recommended Level of Treatment: ASAM Recommended Level of Treatment: Level I Outpatient Treatment   Substance use Disorder (SUD) Substance Use Disorder (SUD)  Checklist Symptoms of Substance Use:  (none)  Recommendations for Services/Supports/Treatments: Recommendations for Services/Supports/Treatments Recommendations For Services/Supports/Treatments: Individual Therapy  Discharge Disposition: Discharge Disposition Medical Exam completed: Yes Disposition of Patient: Admit Mode of transportation if patient is discharged/movement?: Other (comment) (Safe transport)  DSM5 Diagnoses: Patient Active Problem List   Diagnosis Date Noted   Pityriasis versicolor 02/04/2022  Mood disorder (HCC) 02/02/2022   Contraception management 08/22/2021   Non-intractable vomiting 02/21/2018   Depression with anxiety 06/17/2017   Sex counseling 05/20/2017   Osgood-Schlatter's disease 12/26/2015   Well child examination 12/26/2012   Allergic rhinitis 12/06/2009     Referrals to Alternative Service(s): Referred to Alternative Service(s):   Place:   Date:   Time:    Referred to Alternative Service(s):   Place:   Date:   Time:    Referred to Alternative Service(s):   Place:   Date:   Time:    Referred to Alternative Service(s):   Place:   Date:   Time:     Audree Camel, [facility]

## 2022-07-09 NOTE — Assessment & Plan Note (Signed)
-  depo injection administered today per patient preference, patient tolerated well without complications -due in another 3 months, discussed with patient -safe sex counseling

## 2022-07-09 NOTE — Progress Notes (Signed)
Pt refused labdraw

## 2022-07-09 NOTE — Discharge Instructions (Addendum)
Take all medications as prescribed. Keep all follow-up appointments as scheduled.  Do not consume alcohol or use illegal drugs while on prescription medications. Report any adverse effects from your medications to your primary care provider promptly.  In the event of recurrent symptoms or worsening symptoms, call 911, a crisis hotline, or go to the nearest emergency department for evaluation.   

## 2022-07-09 NOTE — Patient Instructions (Addendum)
It was great seeing you today!  Today we discussed your mood, I am worried that are having these thoughts of harming yourself along with a plan. I recommend getting further evaluation at the behavioral health center.   Please follow up at your next scheduled appointment in 1 week, if anything arises between now and then, please don't hesitate to contact our office.   Thank you for allowing [place] to be a part of your medical care!  Thank you, Dr. Larae Grooms

## 2022-07-09 NOTE — ED Notes (Signed)
Pt. Awake , alert, laying quietly in recliner. No resp distress noted. Will continue to monitor for safety.

## 2022-07-10 LAB — POCT PREGNANCY, URINE: Preg Test, Ur: NEGATIVE

## 2022-07-10 NOTE — ED Provider Notes (Signed)
FBC/OBS ASAP Discharge Summary  Date and Time: 07/10/2022 9:43 AM  Name: Danielle Miranda  MRN:  [id number]   Discharge Diagnoses:  Final diagnoses:  Mood disorder (HCC)  Suicidal ideation    History of Present illness: Danielle Miranda is a 21 y.o. female.  Presents to [facility] urgent care after recent visit to her primary care provider.  Patient was transported by safe transport due to suicidal ideations with active plan to harm herself.  Per provider assessment note: " Concerning that patient having active suicidal ideations with a plan although ongoing I am still concerned because she states that she is still open to the idea of harming herself" Reports she has been prescribed Zoloft 50 mg with a recent increase to Zoloft 75 mg.  She reports she was seeking therapy and psychiatry services. However, continues to express passive fleeting thoughts of death.   On Evaluation:  Danielle Miranda seen and evaluated face-to-face by this provider.  Denying suicidal or homicidal ideation.  Denies auditory or visual hallucinations.  Reported plan to discharge home to her father.  Father made aware of reported suicidal ideations related to overdosing on pain medication.  Safety planning at discharge was made regarding locking all medications up.  Consider follow-up with therapy services on outpatient basis.  Patient to continue Zoloft 75 mg daily.  Father denied any other safety concerns at discharge.  Support encouragement and reassurance was provided.    Danielle Miranda is educated and verbalizes understanding of mental health resources and other crisis services in the community.She is instructed to call 911 and present to the nearest emergency room should she experience any suicidal/homicidal ideation, auditory/visual/hallucinations, or detrimental worsening of her mental health condition.She was a also advised by Clinical research associate that she could call the toll-free phone on insurance card to assist with identifying in network  counselors and agencies or number on back of Medicaid card to speak with care coordinator.     Total Time spent with patient: 15 minutes  Past Psychiatric History:  Past Medical History: No past medical history on file. No past surgical history on file. Family History:  Family History  Family history unknown: Yes   Family Psychiatric History:  Social History:  Social History   Substance and Sexual Activity  Alcohol Use No     Social History   Substance and Sexual Activity  Drug Use No    Social History   Socioeconomic History   Marital status: Single    Spouse name: Not on file   Number of children: Not on file   Years of education: Not on file   Highest education level: Not on file  Occupational History   Not on file  Tobacco Use   Smoking status: Never   Smokeless tobacco: Never   Tobacco comments:    Pt doesn't smoke cigarettes  Substance and Sexual Activity   Alcohol use: No   Drug use: No   Sexual activity: Never  Other Topics Concern   Not on file  Social History Narrative   Not on file   Social Determinants of Health   Financial Resource Strain: Not on file  Food Insecurity: Not on file  Transportation Needs: Not on file  Physical Activity: Not on file  Stress: Not on file  Social Connections: Not on file   SDOH:  SDOH Screenings   Depression (PHQ2-9): High Risk (07/09/2022)  Tobacco Use: Low Risk  (07/09/2022)    Tobacco Cessation:  N/A, patient does not currently use tobacco products  Current Medications:  Current Facility-Administered Medications  Medication Dose Route Frequency Provider Last Rate Last Admin   acetaminophen (TYLENOL) tablet 650 mg  650 mg Oral Q6H PRN Oneta Rack, NP       alum & mag hydroxide-simeth (MAALOX/MYLANTA) 200-200-20 MG/5ML suspension 30 mL  30 mL Oral Q4H PRN Oneta Rack, NP       magnesium hydroxide (MILK OF MAGNESIA) suspension 30 mL  30 mL Oral Daily PRN Oneta Rack, NP       Current Outpatient  Medications  Medication Sig Dispense Refill   sertraline (ZOLOFT) 50 MG tablet Take 1 tablet daily with food 30 tablet 0    PTA Medications: (Not in a hospital admission)      07/09/2022    9:49 AM 02/27/2022    3:26 PM 02/20/2022    4:59 PM  Depression screen PHQ 2/9  Decreased Interest 1 3 0  Down, Depressed, Hopeless 3 3 0  PHQ - 2 Score 4 6 0  Altered sleeping 3 3 0  Tired, decreased energy 1 3 0  Change in appetite 2 3 0  Feeling bad or failure about yourself  3 2 0  Trouble concentrating 1 2 0  Moving slowly or fidgety/restless 1 0 0  Suicidal thoughts 3 2 0  PHQ-9 Score 18 21 0  Difficult doing work/chores Somewhat difficult      Flowsheet Row ED from 07/09/2022 in [facility] ED from 06/18/2021 in [facility] ED from 06/09/2021 in [facility] at [facility] RISK CATEGORY Moderate Risk No Risk Error: Question 6 not populated       Musculoskeletal  Strength & Muscle Tone: within normal limits Gait & Station: normal Patient leans: N/A  Psychiatric Specialty Exam  Presentation  General Appearance:  Appropriate for Environment; Casual; Fairly Groomed  Eye Contact: Minimal  Speech: Slow  Speech Volume: Decreased  Handedness: Right   Mood and Affect  Mood: Dysphoric; Anxious  Affect: Constricted; Congruent; Tearful   Thought Process  Thought Processes: Coherent; Goal Directed; Linear  Descriptions of Associations:Intact  Orientation:Full (Time, Place and Person)  Thought Content:Logical; Rumination  Diagnosis of Schizophrenia or Schizoaffective disorder in past: No    Hallucinations:No data recorded Ideas of Reference:None  Suicidal Thoughts:No data recorded Homicidal Thoughts:No data recorded  Sensorium  Memory: Immediate Fair; Recent Fair; Remote Fair  Judgment: Fair  Insight: Fair   Art therapist  Concentration: Fair  Attention  Span: Fair  Recall: [employer] of Knowledge: Fair  Language: Fair   Psychomotor Activity  Psychomotor Activity:No data recorded  Assets  Assets: Communication Skills; Desire for Improvement; Financial Resources/Insurance; Housing; Physical Health; Social Support; Resilience; Vocational/Educational   Sleep  Sleep:No data recorded  No data recorded  Physical Exam  Physical Exam Vitals and nursing note reviewed.  Cardiovascular:     Rate and Rhythm: Normal rate and regular rhythm.  Skin:    General: Skin is warm and dry.  Neurological:     General: No focal deficit present.     Mental Status: She is alert and oriented to person, place, and time.  Psychiatric:        Mood and Affect: Mood normal.        Behavior: Behavior normal.        Thought Content: Thought content normal.    Review of Systems  HENT: Negative.    Eyes: Negative.   Cardiovascular: Negative.  Gastrointestinal: Negative.   Musculoskeletal: Negative.   Psychiatric/Behavioral:  Positive for depression. Negative for suicidal ideas. The patient is nervous/anxious.   All other systems reviewed and are negative.  Blood pressure 118/74, pulse 76, temperature 98.3 F (36.8 C), temperature source Oral, resp. rate 18, SpO2 100 %. There is no height or weight on file to calculate BMI.  Demographic Factors:  Adolescent or young adult  Loss Factors: Financial problems/change in socioeconomic status  Historical Factors: Impulsivity  Risk Reduction Factors:   Employed, Living with another person, especially a relative, Positive social support, and Positive therapeutic relationship  Continued Clinical Symptoms:  Depression:   Impulsivity  Cognitive Features That Contribute To Risk:  Closed-mindedness    Suicide Risk:  Minimal: No identifiable suicidal ideation.  Patients presenting with no risk factors but with morbid ruminations; may be classified as minimal risk based on the severity of the  depressive symptoms  Plan Of Care/Follow-up recommendations:  Activity:  as tolerated Diet:  heart healthy   Disposition: Take all medications as prescribed. Keep all follow-up appointments as scheduled.  Do not consume alcohol or use illegal drugs while on prescription medications. Report any adverse effects from your medications to your primary care provider promptly.  In the event of recurrent symptoms or worsening symptoms, call 911, a crisis hotline, or go to the nearest emergency department for evaluation.    Derrill Center, NP 07/10/2022, 9:43 AM

## 2022-07-10 NOTE — ED Notes (Signed)
Discharge instructions provided and Pt stated understanding. Pt alert, orient and ambulatory prior to d/c from facility. Personal belongings returned from locker number 5. Pt given the opportunity to change into personal clothing in locker room. Pt escorted to the lobby to meet her father to d/c from facility. Safety maintained.

## 2022-07-10 NOTE — ED Notes (Signed)
Pt awake & resting at present, no distress noted, calm & cooperative.  Monitoring for safety. 

## 2022-08-29 ENCOUNTER — Ambulatory Visit (INDEPENDENT_AMBULATORY_CARE_PROVIDER_SITE_OTHER): Payer: Medicaid Other | Admitting: Obstetrics and Gynecology

## 2022-08-29 ENCOUNTER — Other Ambulatory Visit (HOSPITAL_COMMUNITY)
Admission: RE | Admit: 2022-08-29 | Discharge: 2022-08-29 | Disposition: A | Discharge status: Home / Self Care | Payer: Medicaid Other | Source: Ambulatory Visit | Attending: Obstetrics and Gynecology | Admitting: Obstetrics and Gynecology

## 2022-08-29 ENCOUNTER — Encounter: Payer: Self-pay | Admitting: Obstetrics and Gynecology

## 2022-08-29 VITALS — BP 121/69 | HR 81 | Ht 69.0 in | Wt 204.2 lb

## 2022-08-29 DIAGNOSIS — Z01419 Encounter for gynecological examination (general) (routine) without abnormal findings: Secondary | ICD-10-CM | POA: Diagnosis not present

## 2022-08-29 DIAGNOSIS — N898 Other specified noninflammatory disorders of vagina: Secondary | ICD-10-CM

## 2022-08-29 DIAGNOSIS — Z3042 Encounter for surveillance of injectable contraceptive: Secondary | ICD-10-CM

## 2022-08-29 NOTE — Progress Notes (Signed)
NGYN presents to establish care. Pt c/o white, foul smelling discharge for 1 month. Denies pain.  Last Depo injection 07-09-22. Pt wants to continue depo at [facility].  Next Depo due between 12/25 and 1/8.

## 2022-08-29 NOTE — Progress Notes (Signed)
   WELL-WOMAN PHYSICAL Patient name: Danielle Miranda MRN [id number]  Date of birth: [birth year] Chief Complaint:   Establish Care  History of Present Illness:   Danielle Miranda is a 21 y.o. G0P0000 African American female being seen today for a routine well-woman exam.  Current complaints: foul-smelling vaginal discharge x 1 month. She describes the discharge as "going away after a shower, but returning shortly thereafter."  PCP: Reece Leader, MD      does not desire labs No LMP recorded. Patient has had an injection. The current method of family planning is Depo-Provera injections. Last one done 07/09/2022 Last pap n/a d/t age.  Last mammogram: n/a d/t age. Family h/o breast cancer: unsure Last colonoscopy: n/a d/t age. Family h/o colorectal cancer: unsure Review of Systems:   Pertinent items are noted in HPI Denies any headaches, blurred vision, fatigue, shortness of breath, chest pain, abdominal pain, abnormal vaginal discharge/itching/odor/irritation, problems with periods, bowel movements, urination, or intercourse unless otherwise stated above. Pertinent History Reviewed:  Reviewed past medical,surgical, social and family history.  Reviewed problem list, medications and allergies. Physical Assessment:   Vitals:   08/29/22 0954  BP: 121/69  Pulse: 81  Weight: 204 lb 3.2 oz (92.6 kg)  Height: 5\' 9"  (1.753 m)  Body mass index is 30.16 kg/m.        Physical Examination:   General appearance - well appearing, and in no distress  Mental status - alert, oriented to person, place, and time  Psych:  She has a normal mood and affect  Skin - warm and dry, normal color, no suspicious lesions noted  Chest - effort normal, all lung fields clear to auscultation bilaterally  Heart - normal rate and regular rhythm  Neck:  midline trachea, no thyromegaly or nodules  Breasts - breasts appear normal, no suspicious masses, no skin or nipple changes or  axillary nodes  Abdomen - soft, nontender,  nondistended, no masses or organomegaly  Pelvic - VULVA: normal appearing vulva with no masses, tenderness or lesions  VAGINA: not examined. Patient declined speculum exam. Swabs collected using self-swab technique.  Extremities:  No swelling or varicosities noted  No results found for this or any previous visit (from the past 24 hour(s)).  Assessment & Plan:  1) Well woman exam with routine gynecological exam  2) Vaginal discharge  - Cervicovaginal ancillary only( [place])  3) Encounter for surveillance of injectable contraceptive  - Rx: Depo Provera 150 mg due for injection 09/24/2022-10/08/2022  Labs/procedures today: Wet Prep  Mammogram at age 36 or sooner if problems Colonoscopy at age 18 or sooner if problems  No orders of the defined types were placed in this encounter.   Meds: No orders of the defined types were placed in this encounter.   Follow-up: Return if symptoms worsen or fail to improve.  54 MSN, CNM 08/29/2022 10:22 AM

## 2022-08-30 ENCOUNTER — Other Ambulatory Visit: Payer: Self-pay | Admitting: Obstetrics and Gynecology

## 2022-08-30 DIAGNOSIS — A749 Chlamydial infection, unspecified: Secondary | ICD-10-CM

## 2022-08-30 LAB — CERVICOVAGINAL ANCILLARY ONLY
Bacterial Vaginitis (gardnerella): NEGATIVE
Candida Glabrata: NEGATIVE
Candida Vaginitis: NEGATIVE
Chlamydia: POSITIVE — AB
Comment: NEGATIVE
Comment: NEGATIVE
Comment: NEGATIVE
Comment: NEGATIVE
Comment: NEGATIVE
Comment: NORMAL
Neisseria Gonorrhea: NEGATIVE
Trichomonas: NEGATIVE

## 2022-08-30 MED ORDER — DOXYCYCLINE HYCLATE 100 MG PO CAPS: 100.0000 mg | ORAL_CAPSULE | Freq: Two times a day (BID) | ORAL | 0 refills | Status: DC

## 2022-08-31 ENCOUNTER — Other Ambulatory Visit: Payer: Self-pay | Admitting: Obstetrics and Gynecology

## 2022-08-31 DIAGNOSIS — A749 Chlamydial infection, unspecified: Secondary | ICD-10-CM

## 2022-09-01 ENCOUNTER — Other Ambulatory Visit: Payer: Self-pay | Admitting: Obstetrics and Gynecology

## 2022-09-01 DIAGNOSIS — A749 Chlamydial infection, unspecified: Secondary | ICD-10-CM

## 2022-09-03 ENCOUNTER — Telehealth: Payer: Self-pay | Admitting: *Deleted

## 2022-09-03 NOTE — Telephone Encounter (Signed)
Medication sent in 08/30/22. Original prescriber did not authorize refill.

## 2022-09-03 NOTE — Progress Notes (Signed)
TC to pt. Advised of chlamydia and RX sent. Already taking RX. Advised of need for partner to be TX and no unprotected sex for either until at least 7 days after TX. Pt requesting refill RX to TX partner. Declined. Explained that we are unable to [facility] partner because he is not our pt. Advised for partner to seek care at [facility]. Education sent via MyChart.

## 2022-09-03 NOTE — Telephone Encounter (Signed)
Error

## 2022-10-03 ENCOUNTER — Other Ambulatory Visit: Payer: Self-pay | Admitting: Obstetrics and Gynecology

## 2022-10-03 ENCOUNTER — Ambulatory Visit (INDEPENDENT_AMBULATORY_CARE_PROVIDER_SITE_OTHER): Payer: Medicaid Other

## 2022-10-03 DIAGNOSIS — Z3042 Encounter for surveillance of injectable contraceptive: Secondary | ICD-10-CM | POA: Diagnosis not present

## 2022-10-03 MED ORDER — MEDROXYPROGESTERONE ACETATE 150 MG/ML IM SUSP: 150.0000 mg | INTRAMUSCULAR | 11 refills | Status: AC

## 2022-10-03 MED ORDER — MEDROXYPROGESTERONE ACETATE 150 MG/ML IM SUSP
150.0000 mg | INTRAMUSCULAR | Status: AC
  Administered 2022-10-03 – 2024-09-02 (×5): 150 mg via INTRAMUSCULAR

## 2022-10-03 NOTE — Progress Notes (Signed)
Pt is in the office for depo injection, last administered by PCP on 07/09/22. Administered in [place] and pt tolerated well. Next due March 21- April 4. .. Administrations This Visit     medroxyPROGESTERone (DEPO-PROVERA) injection 150 mg     Admin Date 10/03/2022 Action Given Dose 150 mg Route Intramuscular Administered By Hinton Lovely, RN

## 2022-10-18 ENCOUNTER — Other Ambulatory Visit (HOSPITAL_COMMUNITY)
Admission: RE | Admit: 2022-10-18 | Discharge: 2022-10-18 | Disposition: A | Discharge status: Home / Self Care | Payer: Medicaid Other | Source: Ambulatory Visit | Attending: Obstetrics and Gynecology | Admitting: Obstetrics and Gynecology

## 2022-10-18 ENCOUNTER — Ambulatory Visit (INDEPENDENT_AMBULATORY_CARE_PROVIDER_SITE_OTHER): Payer: Medicaid Other

## 2022-10-18 VITALS — BP 120/73 | HR 96 | Ht 69.0 in | Wt 202.0 lb

## 2022-10-18 DIAGNOSIS — Z8619 Personal history of other infectious and parasitic diseases: Secondary | ICD-10-CM | POA: Insufficient documentation

## 2022-10-18 NOTE — Progress Notes (Signed)
..  SUBJECTIVE:  22 y.o. female is in the office for [facility] for +CT. Pt reports that she completed full course of antibiotics. Denies abnormal vaginal bleeding or significant pelvic pain or fever. No UTI symptoms.   No LMP recorded. Patient has had an injection.  OBJECTIVE:  She appears well, afebrile. Urine dipstick: not done.  ASSESSMENT:  TOC   PLAN:  GC, chlamydia, trichomonas, BVAG, CVAG probe sent to lab. Treatment: To be determined once lab results are received ROV prn if symptoms persist or worsen.

## 2022-10-19 LAB — CERVICOVAGINAL ANCILLARY ONLY
Bacterial Vaginitis (gardnerella): NEGATIVE
Candida Glabrata: NEGATIVE
Candida Vaginitis: NEGATIVE
Chlamydia: NEGATIVE
Comment: NEGATIVE
Comment: NEGATIVE
Comment: NEGATIVE
Comment: NEGATIVE
Comment: NEGATIVE
Comment: NORMAL
Neisseria Gonorrhea: NEGATIVE
Trichomonas: NEGATIVE

## 2022-12-27 ENCOUNTER — Ambulatory Visit: Payer: Medicaid Other

## 2022-12-31 ENCOUNTER — Ambulatory Visit (INDEPENDENT_AMBULATORY_CARE_PROVIDER_SITE_OTHER): Payer: Medicaid Other

## 2022-12-31 VITALS — BP 124/76 | HR 80 | Wt 208.4 lb

## 2022-12-31 DIAGNOSIS — Z3042 Encounter for surveillance of injectable contraceptive: Secondary | ICD-10-CM

## 2022-12-31 NOTE — Progress Notes (Signed)
Subjective:  Pt in for pt supply Depo Provera injection.    Objective: No unusual complaints.    Assessment: Depo given R upper outer quadrant. Pt tolerated Depo injection.   Plan:  Next injection due June 17-July 1st.     Administrations This Visit     medroxyPROGESTERone (DEPO-PROVERA) injection 150 mg     Admin Date 12/31/2022 Action Given Dose 150 mg Route Intramuscular Administered By Hollice Gong, CMA

## 2023-02-03 DIAGNOSIS — N898 Other specified noninflammatory disorders of vagina: Secondary | ICD-10-CM | POA: Diagnosis not present

## 2023-02-03 DIAGNOSIS — R300 Dysuria: Secondary | ICD-10-CM | POA: Diagnosis not present

## 2023-02-03 DIAGNOSIS — Z9189 Other specified personal risk factors, not elsewhere classified: Secondary | ICD-10-CM | POA: Diagnosis not present

## 2023-02-04 ENCOUNTER — Ambulatory Visit: Payer: Medicaid Other | Admitting: Advanced Practice Midwife

## 2023-03-10 DIAGNOSIS — R3911 Hesitancy of micturition: Secondary | ICD-10-CM | POA: Diagnosis not present

## 2023-03-10 DIAGNOSIS — R509 Fever, unspecified: Secondary | ICD-10-CM | POA: Diagnosis not present

## 2023-03-10 DIAGNOSIS — J029 Acute pharyngitis, unspecified: Secondary | ICD-10-CM | POA: Diagnosis not present

## 2023-03-10 DIAGNOSIS — B2790 Infectious mononucleosis, unspecified without complication: Secondary | ICD-10-CM | POA: Diagnosis not present

## 2023-03-10 DIAGNOSIS — Z113 Encounter for screening for infections with a predominantly sexual mode of transmission: Secondary | ICD-10-CM | POA: Diagnosis not present

## 2023-03-21 ENCOUNTER — Ambulatory Visit (INDEPENDENT_AMBULATORY_CARE_PROVIDER_SITE_OTHER): Payer: Medicaid Other | Admitting: Emergency Medicine

## 2023-03-21 VITALS — BP 123/79 | HR 98 | Ht 69.0 in | Wt 187.3 lb

## 2023-03-21 DIAGNOSIS — Z3042 Encounter for surveillance of injectable contraceptive: Secondary | ICD-10-CM

## 2023-03-21 MED ORDER — MEDROXYPROGESTERONE ACETATE 150 MG/ML IM SUSP
150.0000 mg | Freq: Once | INTRAMUSCULAR | Status: AC
  Administered 2023-03-21: 150 mg via INTRAMUSCULAR

## 2023-03-21 NOTE — Progress Notes (Signed)
Office stock  Date last pap: NA. Last Depo-Provera: 12/31/2022. Side Effects if any: NA. Serum HCG indicated? NA. Depo-Provera 150 mg IM given by: Resa Miner, RN into RUOQ, tolerated well. Next appointment due Sept 5-19.

## 2023-04-19 DIAGNOSIS — N760 Acute vaginitis: Secondary | ICD-10-CM | POA: Diagnosis not present

## 2023-04-19 DIAGNOSIS — Z7251 High risk heterosexual behavior: Secondary | ICD-10-CM | POA: Diagnosis not present

## 2023-05-30 ENCOUNTER — Encounter (HOSPITAL_COMMUNITY): Payer: Self-pay

## 2023-05-30 ENCOUNTER — Ambulatory Visit (HOSPITAL_COMMUNITY)
Admission: EM | Admit: 2023-05-30 | Discharge: 2023-05-30 | Disposition: A | Discharge status: Home / Self Care | Payer: Medicaid Other | Attending: Emergency Medicine | Admitting: Emergency Medicine

## 2023-05-30 DIAGNOSIS — N898 Other specified noninflammatory disorders of vagina: Secondary | ICD-10-CM | POA: Insufficient documentation

## 2023-05-30 DIAGNOSIS — Z113 Encounter for screening for infections with a predominantly sexual mode of transmission: Secondary | ICD-10-CM | POA: Insufficient documentation

## 2023-05-30 DIAGNOSIS — K219 Gastro-esophageal reflux disease without esophagitis: Secondary | ICD-10-CM | POA: Insufficient documentation

## 2023-05-30 LAB — POCT URINE PREGNANCY: Preg Test, Ur: NEGATIVE

## 2023-05-30 LAB — HIV ANTIBODY (ROUTINE TESTING W REFLEX): HIV Screen 4th Generation wRfx: NONREACTIVE

## 2023-05-30 MED ORDER — PANTOPRAZOLE SODIUM 20 MG PO TBEC: 20.0000 mg | DELAYED_RELEASE_TABLET | Freq: Every day | ORAL | 0 refills | Status: DC

## 2023-05-30 MED ORDER — FAMOTIDINE 20 MG PO TABS: 20.0000 mg | ORAL_TABLET | Freq: Two times a day (BID) | ORAL | 0 refills | Status: DC

## 2023-05-30 NOTE — ED Provider Notes (Signed)
[facility]    CSN: [id number] Arrival date & time: 05/30/23  4098      History   Chief Complaint Chief Complaint  Patient presents with   Vaginal Itching    HPI Danielle Miranda is a 22 y.o. female.  Here for STD testing.  4-5 days of vaginal discharge and discomfort.  Denies known exposure but having unprotected intercourse  Not having abdominal pain, urinary symptoms, rash. LMP unknown, gets Depo injections   Additionally reports ~1 month history of burning sensation in the throat.  Occurs in the middle of the night and sometimes after eating. Diet heavy in spicy foods. Occasionally has nausea. Denies history of reflux. Some epigastric discomfort/fullness   History reviewed. No pertinent past medical history.  Patient Active Problem List   Diagnosis Date Noted   Pityriasis versicolor 02/04/2022   Mood disorder (HCC) 02/02/2022   Contraception management 08/22/2021   Non-intractable vomiting 02/21/2018   Depression with anxiety 06/17/2017   Sex counseling 05/20/2017   Osgood-Schlatter's disease 12/26/2015   Well child examination 12/26/2012   Allergic rhinitis 12/06/2009    History reviewed. No pertinent surgical history.  OB History     Gravida  0   Para  0   Term  0   Preterm  0   AB  0   Living  0      SAB  0   IAB  0   Ectopic  0   Multiple  0   Live Births  0            Home Medications    Prior to Admission medications   Medication Sig Start Date End Date Taking? Authorizing Provider  famotidine (PEPCID) 20 MG tablet Take 1 tablet (20 mg total) by mouth 2 (two) times daily. 05/30/23  Yes Kathren Scearce, Lurena Joiner, PA-C  pantoprazole (PROTONIX) 20 MG tablet Take 1 tablet (20 mg total) by mouth daily. 05/30/23  Yes Shonique Pelphrey, Lurena Joiner, PA-C  medroxyPROGESTERone (DEPO-PROVERA) 150 MG/ML injection Inject 1 mL (150 mg total) into the muscle every 3 (three) months. 10/03/22   Raelyn Mora, CNM    Family History Family History  Family  history unknown: Yes    Social History Social History   Tobacco Use   Smoking status: Never    Passive exposure: Never   Smokeless tobacco: Never   Tobacco comments:    Pt doesn't smoke cigarettes  Vaping Use   Vaping status: Every Day  Substance Use Topics   Alcohol use: No   Drug use: Yes    Types: Marijuana     Allergies   Flonase [fluticasone propionate]   Review of Systems Review of Systems Per HPI  Physical Exam Triage Vital Signs ED Triage Vitals [05/30/23 0932]  Encounter Vitals Group     BP 131/83     Systolic BP Percentile      Diastolic BP Percentile      Pulse Rate 85     Resp 16     Temp 98.5 F (36.9 C)     Temp Source Oral     SpO2 98 %     Weight 185 lb (83.9 kg)     Height 5\' 10"  (1.778 m)     Head Circumference      Peak Flow      Pain Score 6     Pain Loc      Pain Education      Exclude from Growth Chart    No data found.  Updated Vital Signs BP 131/83 (BP Location: Left Arm)   Pulse 85   Temp 98.5 F (36.9 C) (Oral)   Resp 16   Ht 5\' 10"  (1.778 m)   Wt 185 lb (83.9 kg)   SpO2 98%   BMI 26.54 kg/m    Physical Exam Vitals and nursing note reviewed.  Constitutional:      General: She is not in acute distress. HENT:     Mouth/Throat:     Mouth: Mucous membranes are moist.     Pharynx: Oropharynx is clear.  Eyes:     Conjunctiva/sclera: Conjunctivae normal.     Pupils: Pupils are equal, round, and reactive to light.  Cardiovascular:     Rate and Rhythm: Normal rate and regular rhythm.     Heart sounds: Normal heart sounds.  Pulmonary:     Effort: Pulmonary effort is normal.     Breath sounds: Normal breath sounds.  Abdominal:     General: Bowel sounds are normal.     Palpations: Abdomen is soft.     Tenderness: There is abdominal tenderness (mild) in the epigastric area. There is no right CVA tenderness, left CVA tenderness or guarding.  Skin:    General: Skin is warm and dry.  Neurological:     Mental Status:  She is alert and oriented to person, place, and time.     UC Treatments / Results  Labs (all labs ordered are listed, but only abnormal results are displayed) Labs Reviewed  HIV ANTIBODY (ROUTINE TESTING W REFLEX)  RPR  POCT URINE PREGNANCY  CERVICOVAGINAL ANCILLARY ONLY    EKG  Radiology No results found.  Procedures Procedures   Medications Ordered in UC Medications - No data to display  Initial Impression / Assessment and Plan / UC Course  I have reviewed the triage vital signs and the nursing notes.  Pertinent labs & imaging results that were available during my care of the patient were reviewed by me and considered in my medical decision making (see chart for details).  UPT negative Cytology swab pending along with HIV/RPR. Treat positive as indicated Safe sex practices  Other symptoms consistent with reflux. Advised pantoprazole daily for 2 weeks. Can use pepcid 1-2 times daily if needed, recommend at least nightly. Follow with primary care if persisting. Return and ED precautions.  No questions at this time, agrees to plan  Final Clinical Impressions(s) / UC Diagnoses   Final diagnoses:  Screen for STD (sexually transmitted disease)  Vaginal discharge  Gastroesophageal reflux disease without esophagitis     Discharge Instructions      Negative pregnancy test We will call you if anything on your swab returns positive. You can also see these results on MyChart. Please abstain from sexual intercourse until your results return.  For reflux symptoms, please take the Protonix once daily for the next 2 weeks. I recommend to take in the morning before breakfast. You can also use pepcid 1-2 times daily. Use at least nightly to help with overnight symptoms. Please call your primary care provider for follow up     ED Prescriptions     Medication Sig Dispense Auth. Provider   pantoprazole (PROTONIX) 20 MG tablet Take 1 tablet (20 mg total) by mouth daily. 14  tablet Kerriann Kamphuis, PA-C   famotidine (PEPCID) 20 MG tablet Take 1 tablet (20 mg total) by mouth 2 (two) times daily. 30 tablet Burch Marchuk, Lurena Joiner, PA-C      PDMP not reviewed this encounter.  Marlow Baars, [place] 05/30/23 3016

## 2023-05-30 NOTE — Discharge Instructions (Addendum)
Negative pregnancy test We will call you if anything on your swab returns positive. You can also see these results on MyChart. Please abstain from sexual intercourse until your results return.  For reflux symptoms, please take the Protonix once daily for the next 2 weeks. I recommend to take in the morning before breakfast. You can also use pepcid 1-2 times daily. Use at least nightly to help with overnight symptoms. Please call your primary care provider for follow up

## 2023-05-30 NOTE — ED Triage Notes (Signed)
Patient here today with c/o vaginal itching and discharge, and soreness X 4-5 days. Patient would like to be tested for all STDs.   Patient also wanted to ask about a burning sensation in her throat that she has been having in the middle of the night for about a month. She also noticed it after eating at times. Sometimes she will have N&V.

## 2023-05-31 ENCOUNTER — Telehealth: Payer: Self-pay

## 2023-05-31 LAB — RPR: RPR Ser Ql: NONREACTIVE

## 2023-05-31 MED ORDER — METRONIDAZOLE 500 MG PO TABS: 500.0000 mg | ORAL_TABLET | Freq: Two times a day (BID) | ORAL | 0 refills | Status: AC

## 2023-05-31 MED ORDER — FLUCONAZOLE 150 MG PO TABS: 150.0000 mg | ORAL_TABLET | Freq: Once | ORAL | 0 refills | Status: AC

## 2023-05-31 NOTE — Telephone Encounter (Signed)
 Per protocol, pt requires tx with metronidazole and Diflucan. Attempted to reach patient x1. LVM. Rx sent to pharmacy on file.

## 2023-06-06 LAB — CERVICOVAGINAL ANCILLARY ONLY
Bacterial Vaginitis (gardnerella): POSITIVE — AB
Candida Glabrata: NEGATIVE
Candida Vaginitis: POSITIVE — AB
Chlamydia: NEGATIVE
Comment: NEGATIVE
Comment: NEGATIVE
Comment: NEGATIVE
Comment: NEGATIVE
Comment: NEGATIVE
Comment: NORMAL
Neisseria Gonorrhea: NEGATIVE
Trichomonas: NEGATIVE

## 2023-06-12 ENCOUNTER — Ambulatory Visit: Payer: Medicaid Other

## 2023-06-20 ENCOUNTER — Ambulatory Visit (INDEPENDENT_AMBULATORY_CARE_PROVIDER_SITE_OTHER): Payer: Medicaid Other

## 2023-06-20 DIAGNOSIS — Z3042 Encounter for surveillance of injectable contraceptive: Secondary | ICD-10-CM

## 2023-06-20 NOTE — Progress Notes (Signed)
Pt is in the office for depo injection. Administered in and pt tolerated well. Next due Dec 5- Dec 19 .Marland Kitchen Administrations This Visit     medroxyPROGESTERone (DEPO-PROVERA) injection 150 mg     Admin Date 06/20/2023 Action Given Dose 150 mg Route Intramuscular Documented By Katrina Stack, RN

## 2023-07-03 ENCOUNTER — Telehealth: Payer: Self-pay

## 2023-07-03 NOTE — Telephone Encounter (Signed)
Patient called and left message stating that she is having vaginal burning and wanted to discuss her symptoms and treatment options.  Returned patient call. No answer. Left vm for patient to return call to the office.

## 2023-09-06 ENCOUNTER — Ambulatory Visit: Payer: Medicaid Other

## 2023-09-17 ENCOUNTER — Ambulatory Visit (INDEPENDENT_AMBULATORY_CARE_PROVIDER_SITE_OTHER): Payer: Medicaid Other | Admitting: Emergency Medicine

## 2023-09-17 ENCOUNTER — Ambulatory Visit: Payer: Medicaid Other

## 2023-09-17 VITALS — BP 116/71 | HR 81 | Wt 194.0 lb

## 2023-09-17 DIAGNOSIS — Z3042 Encounter for surveillance of injectable contraceptive: Secondary | ICD-10-CM | POA: Diagnosis not present

## 2023-09-17 MED ORDER — MEDROXYPROGESTERONE ACETATE 150 MG/ML IM SUSP
150.0000 mg | Freq: Once | INTRAMUSCULAR | Status: AC
Start: 2023-09-17 — End: 2023-09-17
  Administered 2023-09-17: 150 mg via INTRAMUSCULAR

## 2023-09-17 NOTE — Progress Notes (Signed)
Date last pap: NA. Last Depo-Provera: 06/20/2023. Side Effects if any: NA. Serum HCG indicated? NA. Depo-Provera 150 mg IM given by: Resa Miner, RN into RUOQ, tolerated well. Next appointment due: March 4-18

## 2023-10-03 DIAGNOSIS — N898 Other specified noninflammatory disorders of vagina: Secondary | ICD-10-CM | POA: Diagnosis not present

## 2023-10-03 DIAGNOSIS — R300 Dysuria: Secondary | ICD-10-CM | POA: Diagnosis not present

## 2023-10-28 ENCOUNTER — Encounter: Payer: Self-pay | Admitting: Obstetrics

## 2023-10-28 ENCOUNTER — Other Ambulatory Visit (HOSPITAL_COMMUNITY)
Admission: RE | Admit: 2023-10-28 | Discharge: 2023-10-28 | Disposition: A | Discharge status: Home / Self Care | Payer: Medicaid Other | Source: Ambulatory Visit | Attending: Obstetrics | Admitting: Obstetrics

## 2023-10-28 ENCOUNTER — Ambulatory Visit (INDEPENDENT_AMBULATORY_CARE_PROVIDER_SITE_OTHER): Payer: Medicaid Other | Admitting: Obstetrics

## 2023-10-28 VITALS — BP 117/75 | HR 89 | Ht 69.0 in | Wt 187.4 lb

## 2023-10-28 DIAGNOSIS — N898 Other specified noninflammatory disorders of vagina: Secondary | ICD-10-CM

## 2023-10-28 DIAGNOSIS — Z01419 Encounter for gynecological examination (general) (routine) without abnormal findings: Secondary | ICD-10-CM

## 2023-10-28 DIAGNOSIS — Z3042 Encounter for surveillance of injectable contraceptive: Secondary | ICD-10-CM | POA: Diagnosis not present

## 2023-10-28 MED ORDER — METRONIDAZOLE 0.75 % VA GEL: 1.0000 | Freq: Two times a day (BID) | VAGINAL | 2 refills | Status: DC

## 2023-10-28 NOTE — Progress Notes (Signed)
Subjective:        Danielle Miranda is a 23 y.o. female here for a routine exam.  Current complaints: Vaginal discharge.    Personal health questionnaire:  Is patient Ashkenazi Jewish, have a family history of breast and/or ovarian cancer: no Is there a family history of uterine cancer diagnosed at age < 55, gastrointestinal cancer, urinary tract cancer, family member who is a Personnel officer syndrome-associated carrier: no Is the patient overweight and hypertensive, family history of diabetes, personal history of gestational diabetes, preeclampsia or PCOS: no Is patient over 10, have PCOS,  family history of premature CHD under age 56, diabetes, smoke, have hypertension or peripheral artery disease:  no At any time, has a partner hit, kicked or otherwise hurt or frightened you?: no Over the past 2 weeks, have you felt down, depressed or hopeless?: no Over the past 2 weeks, have you felt little interest or pleasure in doing things?:no   Gynecologic History No LMP recorded. Patient has had an injection. Contraception: Depo-Provera injections Last Pap: n/a. Results were: n/a Last mammogram: n/a. Results were: n/a  Obstetric History OB History  Gravida Para Term Preterm AB Living  0 0 0 0 0 0  SAB IAB Ectopic Multiple Live Births  0 0 0 0 0    History reviewed. No pertinent past medical history.  History reviewed. No pertinent surgical history.   Current Outpatient Medications:    medroxyPROGESTERone (DEPO-PROVERA) 150 MG/ML injection, Inject 1 mL (150 mg total) into the muscle every 3 (three) months., Disp: 1 mL, Rfl: 11   metroNIDAZOLE (METROGEL) 0.75 % vaginal gel, Place 1 Applicatorful vaginally 2 (two) times daily., Disp: 70 g, Rfl: 2   famotidine (PEPCID) 20 MG tablet, Take 1 tablet (20 mg total) by mouth 2 (two) times daily. (Patient not taking: Reported on 10/28/2023), Disp: 30 tablet, Rfl: 0   pantoprazole (PROTONIX) 20 MG tablet, Take 1 tablet (20 mg total) by mouth daily. (Patient  not taking: Reported on 10/28/2023), Disp: 14 tablet, Rfl: 0  Current Facility-Administered Medications:    medroxyPROGESTERone (DEPO-PROVERA) injection 150 mg, 150 mg, Intramuscular, Q90 days, Arita Miss, [place], CNM, 150 mg at 06/20/23 1350 Allergies  Allergen Reactions   Flonase [Fluticasone Propionate]     Headache with use of Flonase    Social History   Tobacco Use   Smoking status: Never    Passive exposure: Never   Smokeless tobacco: Never   Tobacco comments:    Pt doesn't smoke cigarettes  Substance Use Topics   Alcohol use: No    Family History  Family history unknown: Yes      Review of Systems  Constitutional: negative for fatigue and weight loss Respiratory: negative for cough and wheezing Cardiovascular: negative for chest pain, fatigue and palpitations Gastrointestinal: negative for abdominal pain and change in bowel habits Musculoskeletal:negative for myalgias Neurological: negative for gait problems and tremors Behavioral/Psych: negative for abusive relationship, depression Endocrine: negative for temperature intolerance    Genitourinary: positive for malodorous vaginal discharge.  negative for abnormal menstrual periods, genital lesions, hot flashes, sexual problems  Integument/breast: negative for breast lump, breast tenderness, nipple discharge and skin lesion(s)    Objective:       BP 117/75   Pulse 89   Ht 5\' 9"  (1.753 m)   Wt 187 lb 6.4 oz (85 kg)   BMI 27.67 kg/m  General:   Alert and no distress  Skin:   no rash or abnormalities  Lungs:   clear to auscultation  bilaterally  Heart:   regular rate and rhythm, S1, S2 normal, no murmur, click, rub or gallop  Breasts:   normal without suspicious masses, skin or nipple changes or axillary nodes  Abdomen:  normal findings: no organomegaly, soft, non-tender and no hernia  Pelvis:  External genitalia: normal general appearance Urinary system: urethral meatus normal and bladder without fullness,  nontender Vaginal: normal without tenderness, induration or masses Cervix: normal appearance Adnexa: normal bimanual exam Uterus: anteverted and non-tender, normal size   Lab Review Urine pregnancy test Labs reviewed yes Radiologic studies reviewed no  I have spent a total of 30 minutes of face-to-face time, excluding clinical staff time, reviewing notes and preparing to see patient, ordering tests and/or medications, and counseling the patient.   Assessment:    1. Well woman exam with routine gynecological exam (Primary) - doing well  2. Vaginal discharge Rx: - Cervicovaginal ancillary only( [facility]) - metroNIDAZOLE (METROGEL) 0.75 % vaginal gel; Place 1 Applicatorful vaginally 2 (two) times daily.  Dispense: 70 g; Refill: 2  3. Encounter for surveillance of injectable contraceptive [Z30.42]       Plan:    Education reviewed: calcium supplements, depression evaluation, low fat, low cholesterol diet, safe sex/STD prevention, self breast exams, and weight bearing exercise. Contraception: Depo-Provera injections. Follow up in: 1 year.   Meds ordered this encounter  Medications   metroNIDAZOLE (METROGEL) 0.75 % vaginal gel    Sig: Place 1 Applicatorful vaginally 2 (two) times daily.    Dispense:  70 g    Refill:  2    Brock Bad, MD, FACOG Attending Obstetrician & Gynecologist, [facility] for [facility], [facility], [place] 10/28/2023

## 2023-10-28 NOTE — Progress Notes (Signed)
C/O vaginal discharge. Hx BV. Wants testing today. Staying on Depo. Next injection Mar 4th. Needs letter for missing school.

## 2023-10-29 LAB — CERVICOVAGINAL ANCILLARY ONLY
Bacterial Vaginitis (gardnerella): POSITIVE — AB
Candida Glabrata: NEGATIVE
Candida Vaginitis: NEGATIVE
Chlamydia: NEGATIVE
Comment: NEGATIVE
Comment: NEGATIVE
Comment: NEGATIVE
Comment: NEGATIVE
Comment: NEGATIVE
Comment: NORMAL
Neisseria Gonorrhea: NEGATIVE
Trichomonas: NEGATIVE

## 2023-12-03 ENCOUNTER — Ambulatory Visit: Payer: Medicaid Other

## 2023-12-03 DIAGNOSIS — Z3042 Encounter for surveillance of injectable contraceptive: Secondary | ICD-10-CM | POA: Diagnosis not present

## 2023-12-03 MED ORDER — MEDROXYPROGESTERONE ACETATE 150 MG/ML IM SUSP
150.0000 mg | Freq: Once | INTRAMUSCULAR | Status: AC
Start: 2023-12-03 — End: 2023-12-03
  Administered 2023-12-03: 150 mg via INTRAMUSCULAR

## 2023-12-03 MED ORDER — MEDROXYPROGESTERONE ACETATE 150 MG/ML IM SUSP: 150.0000 mg | INTRAMUSCULAR | 11 refills | Status: DC

## 2023-12-03 NOTE — Progress Notes (Signed)
 Date last pap: N/A Annual: 10/28/23. Last Depo-Provera: 09/17/23. Side Effects if any: NA. Serum HCG indicated? NA. Depo-Provera 150 mg IM given by: Karma Ganja, RN. Next appointment due May 20th - June 3rd 2025.

## 2023-12-09 DIAGNOSIS — R110 Nausea: Secondary | ICD-10-CM | POA: Diagnosis not present

## 2023-12-09 DIAGNOSIS — J029 Acute pharyngitis, unspecified: Secondary | ICD-10-CM | POA: Diagnosis not present

## 2023-12-09 DIAGNOSIS — R519 Headache, unspecified: Secondary | ICD-10-CM | POA: Diagnosis not present

## 2024-02-06 DIAGNOSIS — N760 Acute vaginitis: Secondary | ICD-10-CM | POA: Diagnosis not present

## 2024-02-06 DIAGNOSIS — B9789 Other viral agents as the cause of diseases classified elsewhere: Secondary | ICD-10-CM | POA: Diagnosis not present

## 2024-03-02 ENCOUNTER — Ambulatory Visit

## 2024-03-12 ENCOUNTER — Ambulatory Visit

## 2024-03-12 VITALS — BP 120/77 | HR 97

## 2024-03-12 DIAGNOSIS — Z3042 Encounter for surveillance of injectable contraceptive: Secondary | ICD-10-CM

## 2024-03-12 DIAGNOSIS — Z3202 Encounter for pregnancy test, result negative: Secondary | ICD-10-CM

## 2024-03-12 LAB — POCT URINE PREGNANCY: Preg Test, Ur: NEGATIVE

## 2024-03-12 MED ORDER — MEDROXYPROGESTERONE ACETATE 150 MG/ML IM SUSP
150.0000 mg | Freq: Once | INTRAMUSCULAR | Status: AC
Start: 2024-03-12 — End: 2024-03-12
  Administered 2024-03-12: 150 mg via INTRAMUSCULAR

## 2024-03-12 NOTE — Progress Notes (Signed)
 Date last pap: 10/28/23. Last Depo-Provera : 12/16/23. Side Effects if any: N/A. Serum HCG indicated? N/A.  Pt is 9 days outside of depo window. Pt has not had unprotected intercourse within the past 2 weeks. Per protocol, UPT obtained (negative); injection given.  Depo-Provera  150 mg IM given by: Claudio Culver, RN in [phone] . Next appointment due August 28-September 11.

## 2024-05-02 DIAGNOSIS — R1110 Vomiting, unspecified: Secondary | ICD-10-CM | POA: Diagnosis not present

## 2024-05-02 DIAGNOSIS — I499 Cardiac arrhythmia, unspecified: Secondary | ICD-10-CM | POA: Diagnosis not present

## 2024-05-02 DIAGNOSIS — Z202 Contact with and (suspected) exposure to infections with a predominantly sexual mode of transmission: Secondary | ICD-10-CM | POA: Diagnosis not present

## 2024-05-02 DIAGNOSIS — R000 Tachycardia, unspecified: Secondary | ICD-10-CM | POA: Diagnosis not present

## 2024-06-05 ENCOUNTER — Ambulatory Visit

## 2024-06-10 ENCOUNTER — Ambulatory Visit (INDEPENDENT_AMBULATORY_CARE_PROVIDER_SITE_OTHER)

## 2024-06-10 VITALS — BP 110/70 | HR 71

## 2024-06-10 DIAGNOSIS — Z3042 Encounter for surveillance of injectable contraceptive: Secondary | ICD-10-CM

## 2024-06-10 NOTE — Progress Notes (Signed)
 Pt is in the office for depo injection. Administered in [facility] and pt tolerated well. Next due Nov 26-Dec 10 .SABRA Administrations This Visit     medroxyPROGESTERone  (DEPO-PROVERA ) injection 150 mg     Admin Date 06/10/2024 Action Given Dose 150 mg Route Intramuscular Documented By Doneta Laymon BIRCH, RN

## 2024-08-26 ENCOUNTER — Ambulatory Visit

## 2024-09-02 ENCOUNTER — Ambulatory Visit

## 2024-09-02 VITALS — BP 121/75 | HR 80 | Wt 194.6 lb

## 2024-09-02 DIAGNOSIS — Z3042 Encounter for surveillance of injectable contraceptive: Secondary | ICD-10-CM

## 2024-09-02 NOTE — Progress Notes (Signed)
 Date last pap: N/a Last Depo-Provera : 06/10/24. Side Effects if any: N/a. Serum HCG indicated? N/a. Depo-Provera  150 mg IM RUOQ Next appointment due Feb 18- March 4. Administrations This Visit     medroxyPROGESTERone  (DEPO-PROVERA ) injection 150 mg     Admin Date 09/02/2024 Action Given Dose 150 mg Route Intramuscular Documented By Doneta Laymon BIRCH, RN

## 2024-10-20 ENCOUNTER — Encounter (HOSPITAL_COMMUNITY): Payer: Self-pay | Admitting: *Deleted

## 2024-10-20 ENCOUNTER — Ambulatory Visit (HOSPITAL_COMMUNITY)
Admission: EM | Admit: 2024-10-20 | Discharge: 2024-10-20 | Disposition: A | Discharge status: Home / Self Care | Attending: Family Medicine | Admitting: Family Medicine

## 2024-10-20 ENCOUNTER — Other Ambulatory Visit: Payer: Self-pay

## 2024-10-20 DIAGNOSIS — Z113 Encounter for screening for infections with a predominantly sexual mode of transmission: Secondary | ICD-10-CM | POA: Diagnosis not present

## 2024-10-20 DIAGNOSIS — N898 Other specified noninflammatory disorders of vagina: Secondary | ICD-10-CM | POA: Diagnosis not present

## 2024-10-20 NOTE — ED Provider Notes (Signed)
" °  [facility]   [id number] 10/20/24 Arrival Time: 0802  ASSESSMENT & PLAN:  1. Screening for STDs (sexually transmitted diseases)   2. Vaginal discharge    No empiric tx.    Discharge Instructions      We have sent testing for sexually transmitted infections. We will notify you of any positive results once they are received. If required, we will prescribe any medications you might need.  Please refrain from all sexual activity for at least the next seven days.     Without s/s of PID.  Labs Reviewed  HIV ANTIBODY (ROUTINE TESTING W REFLEX)  SYPHILIS: RPR W/REFLEX TO RPR TITER AND TREPONEMAL ANTIBODIES, TRADITIONAL SCREENING AND DIAGNOSIS ALGORITHM  CERVICOVAGINAL ANCILLARY ONLY   Will notify of any positive results.  Reviewed expectations re: course of current medical issues. Questions answered. Outlined signs and symptoms indicating need for more acute intervention. Patient verbalized understanding. After Visit Summary given.   SUBJECTIVE:  Danielle Miranda is a 24 y.o. female who requests STD testing. No symptoms.  No LMP recorded. Patient has had an injection.   OBJECTIVE:  Vitals:   10/20/24 0834  BP: 125/79  Pulse: 73  Resp: 20  Temp: 98.8 F (37.1 C)  SpO2: 98%     General appearance: alert, cooperative, appears stated age and no distress Psychological: alert and cooperative; normal mood and affect.    Labs Reviewed  HIV ANTIBODY (ROUTINE TESTING W REFLEX)  SYPHILIS: RPR W/REFLEX TO RPR TITER AND TREPONEMAL ANTIBODIES, TRADITIONAL SCREENING AND DIAGNOSIS ALGORITHM  CERVICOVAGINAL ANCILLARY ONLY    Allergies[1]  History reviewed. No pertinent past medical history. Family History  Family history unknown: Yes   Social History   Socioeconomic History   Marital status: Single    Spouse name: Not on file   Number of children: Not on file   Years of education: Not on file   Highest education level: Not on file  Occupational  History   Not on file  Tobacco Use   Smoking status: Never    Passive exposure: Never   Smokeless tobacco: Never   Tobacco comments:    Pt doesn't smoke cigarettes  Vaping Use   Vaping status: Former   Quit date: 10/01/2023  Substance and Sexual Activity   Alcohol use: No   Drug use: Not on file   Sexual activity: Yes    Birth control/protection: Injection  Other Topics Concern   Not on file  Social History Narrative   Not on file   Social Drivers of Health   Tobacco Use: Low Risk (10/20/2024)   Patient History    Smoking Tobacco Use: Never    Smokeless Tobacco Use: Never    Passive Exposure: Never  Financial Resource Strain: Not on file  Food Insecurity: Not on file  Transportation Needs: Not on file  Physical Activity: Not on file  Stress: Not on file  Social Connections: Not on file  Intimate Partner Violence: Not on file  Depression (PHQ2-9): Low Risk (10/28/2023)   Depression (PHQ2-9)    PHQ-2 Score: 0  Alcohol Screen: Not on file  Housing: Not on file  Utilities: Not on file  Health Literacy: Not on file            [1]  Allergies Allergen Reactions   Flonase  [Fluticasone  Propionate]     Headache with use of Flonase      Rolinda Rogue, MD 10/20/24 9094  "

## 2024-10-20 NOTE — Discharge Instructions (Addendum)
 We have sent testing for sexually transmitted infections. We will notify you of any positive results once they are received. If required, we will prescribe any medications you might need.  Please refrain from all sexual activity for at least the next seven days.

## 2024-10-20 NOTE — ED Notes (Signed)
 Provider informed Pt will return for blood work.

## 2024-10-20 NOTE — ED Triage Notes (Signed)
 Pt wants testing for STD because she has a clear vaginal discharge and wants to be checked for HIV.  If Pt is treated for BV she request the gel not PIll . The pill makes her nauseated.

## 2024-10-20 NOTE — ED Notes (Signed)
 3# attempts to draw blood for STD . Unable to obtain blood. Pt was instructed to return later and increase PO intake.

## 2024-10-21 ENCOUNTER — Ambulatory Visit (HOSPITAL_COMMUNITY): Payer: Self-pay

## 2024-10-21 LAB — CERVICOVAGINAL ANCILLARY ONLY
Bacterial Vaginitis (gardnerella): POSITIVE — AB
Candida Glabrata: NEGATIVE
Candida Vaginitis: NEGATIVE
Chlamydia: NEGATIVE
Comment: NEGATIVE
Comment: NEGATIVE
Comment: NEGATIVE
Comment: NEGATIVE
Comment: NEGATIVE
Comment: NORMAL
Neisseria Gonorrhea: NEGATIVE
Trichomonas: NEGATIVE

## 2024-10-21 MED ORDER — METRONIDAZOLE 500 MG PO TABS: 500.0000 mg | ORAL_TABLET | Freq: Two times a day (BID) | ORAL | 0 refills | Status: AC

## 2024-10-22 MED ORDER — METRONIDAZOLE 0.75 % VA GEL: 1.0000 | Freq: Every day | VAGINAL | 0 refills | Status: AC

## 2024-11-18 ENCOUNTER — Ambulatory Visit
